# Patient Record
Sex: Female | Born: 1935 | Race: White | Hispanic: No | Smoking: Former smoker
Health system: Southern US, Community
[De-identification: ages and names within clinical notes are randomized; demographics above are authoritative.]

## PROBLEM LIST (undated history)

## (undated) DIAGNOSIS — G3183 Dementia with Lewy bodies: Principal | ICD-10-CM

## (undated) DIAGNOSIS — F0280 Dementia in other diseases classified elsewhere without behavioral disturbance: Secondary | ICD-10-CM

## (undated) DIAGNOSIS — C801 Malignant (primary) neoplasm, unspecified: Secondary | ICD-10-CM

## (undated) DIAGNOSIS — R413 Other amnesia: Secondary | ICD-10-CM

## (undated) HISTORY — PX: BREAST LUMPECTOMY: SHX2

## (undated) HISTORY — DX: Malignant (primary) neoplasm, unspecified: C80.1

## (undated) HISTORY — DX: Dementia in other diseases classified elsewhere without behavioral disturbance: F02.80

## (undated) HISTORY — DX: Dementia with Lewy bodies: G31.83

## (undated) HISTORY — DX: Other amnesia: R41.3

## (undated) HISTORY — PX: VAGINAL HYSTERECTOMY: SHX2639

---

## 2000-07-07 ENCOUNTER — Other Ambulatory Visit: Admission: RE | Admit: 2000-07-07 | Discharge: 2000-07-07 | Payer: Self-pay | Admitting: Internal Medicine

## 2002-11-27 ENCOUNTER — Encounter: Admission: RE | Admit: 2002-11-27 | Discharge: 2002-11-27 | Payer: Self-pay | Admitting: Orthopedic Surgery

## 2002-12-03 ENCOUNTER — Encounter: Payer: Self-pay | Admitting: Orthopedic Surgery

## 2002-12-03 ENCOUNTER — Encounter: Admission: RE | Admit: 2002-12-03 | Discharge: 2002-12-03 | Payer: Self-pay | Admitting: Orthopedic Surgery

## 2002-12-23 ENCOUNTER — Emergency Department (HOSPITAL_COMMUNITY): Admission: EM | Admit: 2002-12-23 | Discharge: 2002-12-23 | Payer: Self-pay | Admitting: Emergency Medicine

## 2002-12-23 ENCOUNTER — Encounter: Payer: Self-pay | Admitting: Emergency Medicine

## 2006-12-17 ENCOUNTER — Ambulatory Visit: Payer: Self-pay | Admitting: Internal Medicine

## 2006-12-24 ENCOUNTER — Ambulatory Visit: Payer: Self-pay | Admitting: Gastroenterology

## 2006-12-31 ENCOUNTER — Encounter (INDEPENDENT_AMBULATORY_CARE_PROVIDER_SITE_OTHER): Payer: Self-pay | Admitting: *Deleted

## 2006-12-31 ENCOUNTER — Ambulatory Visit (HOSPITAL_COMMUNITY): Admission: RE | Admit: 2006-12-31 | Discharge: 2006-12-31 | Payer: Self-pay | Admitting: Internal Medicine

## 2007-01-31 ENCOUNTER — Ambulatory Visit: Payer: Self-pay | Admitting: Internal Medicine

## 2009-04-19 ENCOUNTER — Ambulatory Visit: Payer: Self-pay | Admitting: Psychology

## 2009-06-28 ENCOUNTER — Observation Stay (HOSPITAL_COMMUNITY): Admission: AD | Admit: 2009-06-28 | Discharge: 2009-07-02 | Payer: Self-pay | Admitting: Neurology

## 2011-01-29 IMAGING — CT CT HEAD W/O CM
1 of 2 series · 16 of 30 positions shown, 20 images · non-contrast
Comparison: None.

CLINICAL DATA: Parkinson's disease.  Fell at home.  History of
breast malignancy.  Dementia.

CT HEAD WITHOUT CONTRAST
TECHNIQUE: Contiguous axial images were obtained from the base of
the skull through the vertex without contrast.

[Series 3: recon 2: brain · axial · 0.47mm/px · z∈[+138,+272]mm · 16 of 56 slices shown, 20 images]
[im 3/56  brain]
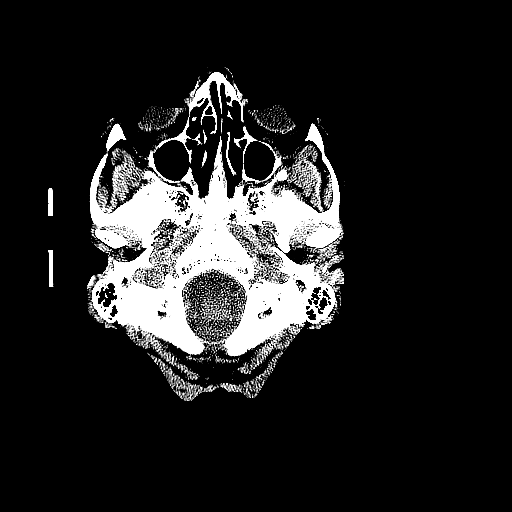
[im 3/56  bone]
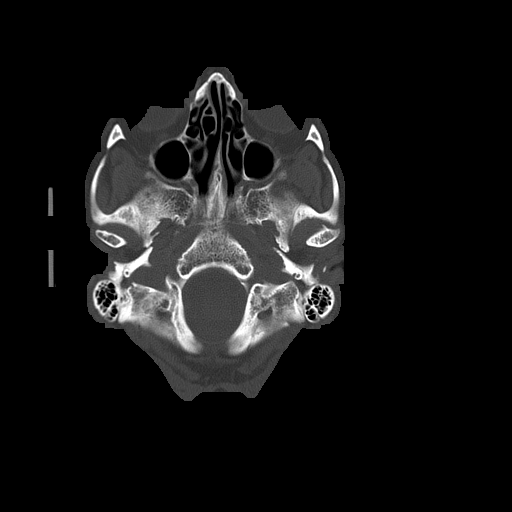
[im 6/56  brain]
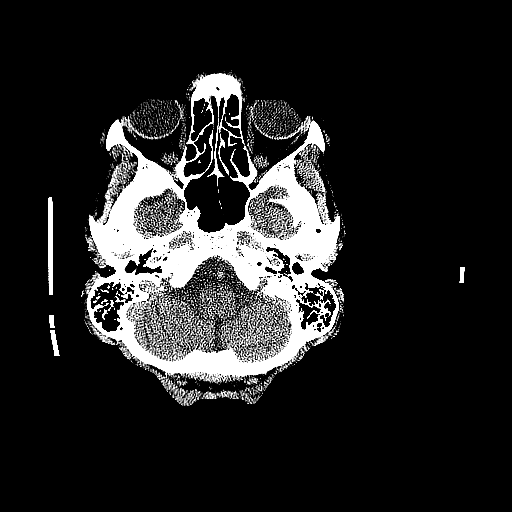
[im 9/56  brain]
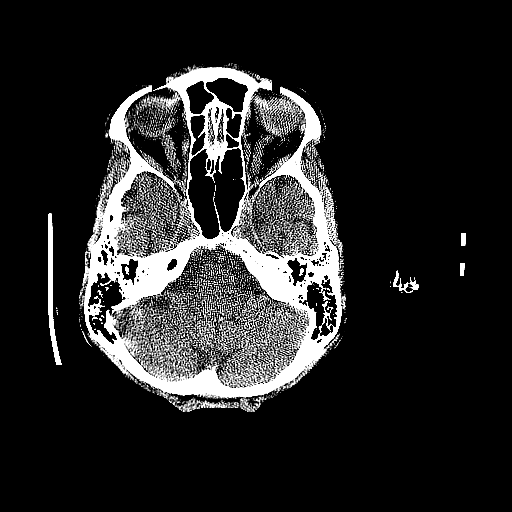
[im 12/56  brain]
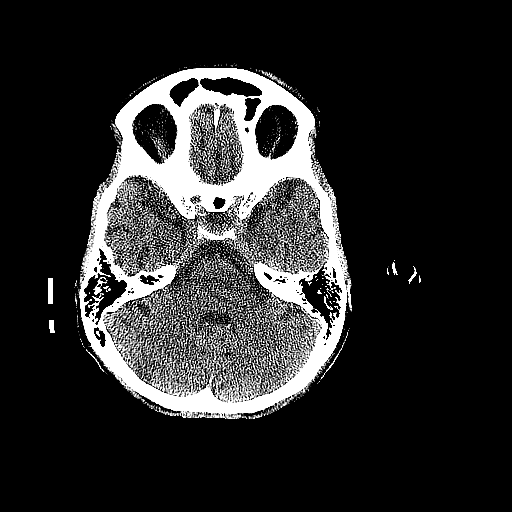
[im 18/56  brain]
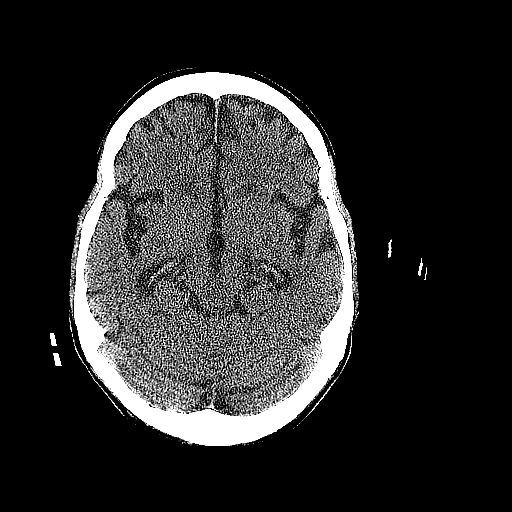
[im 18/56  bone]
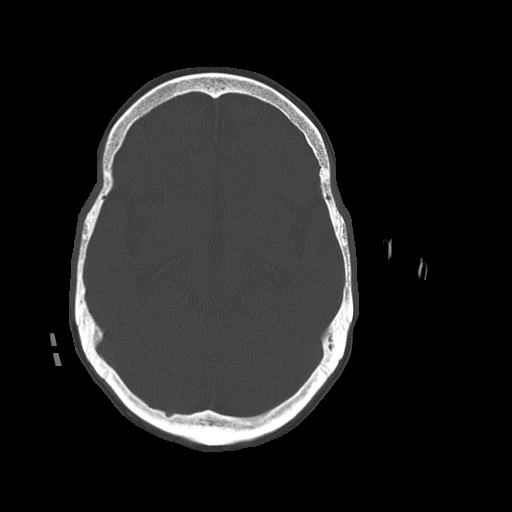
[im 21/56  brain]
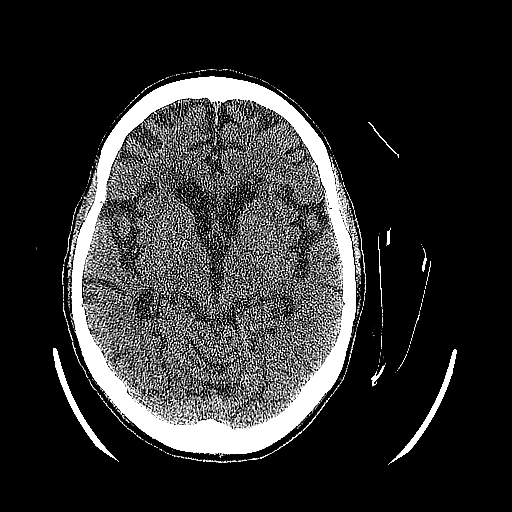
[im 24/56  brain]
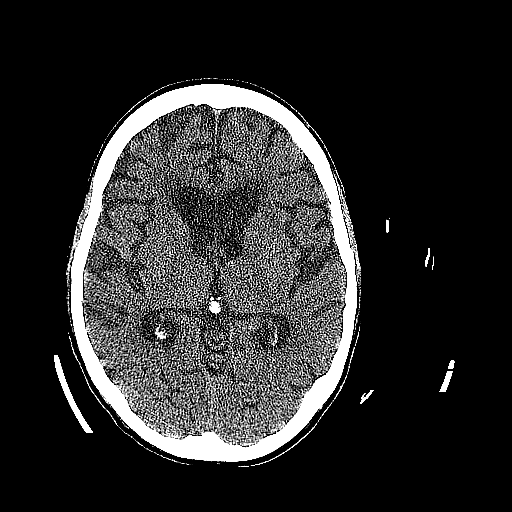
[im 27/56  brain]
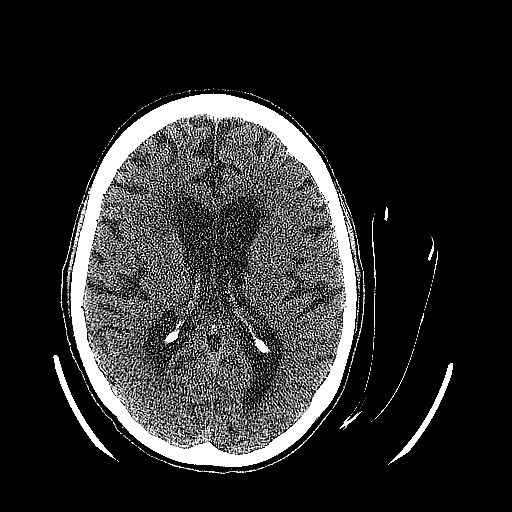
[im 29/56  brain]
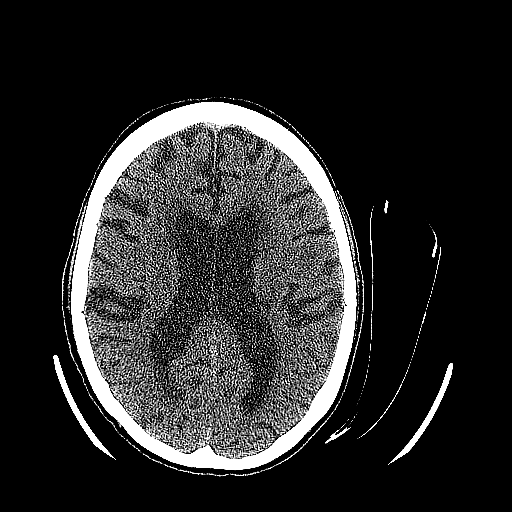
[im 29/56  bone]
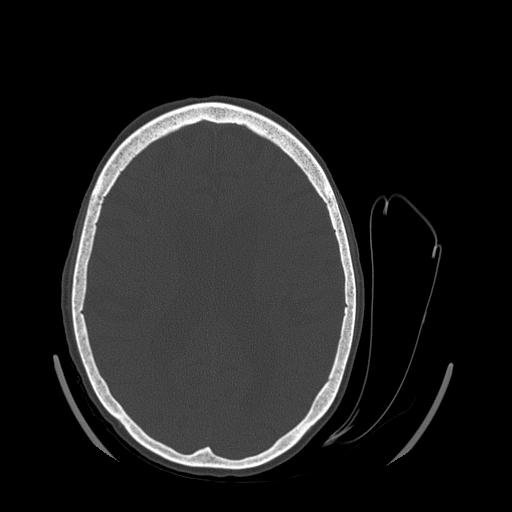
[im 32/56  brain]
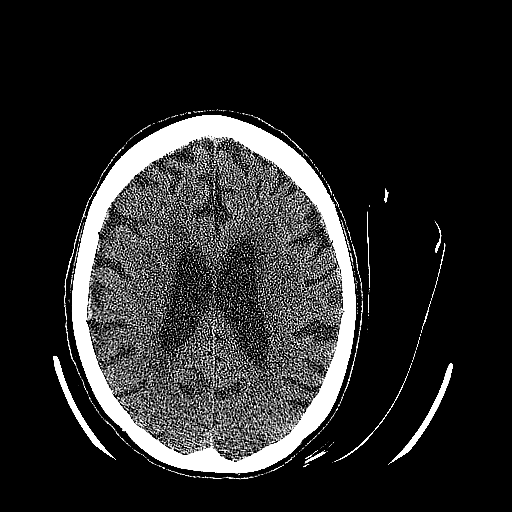
[im 35/56  brain]
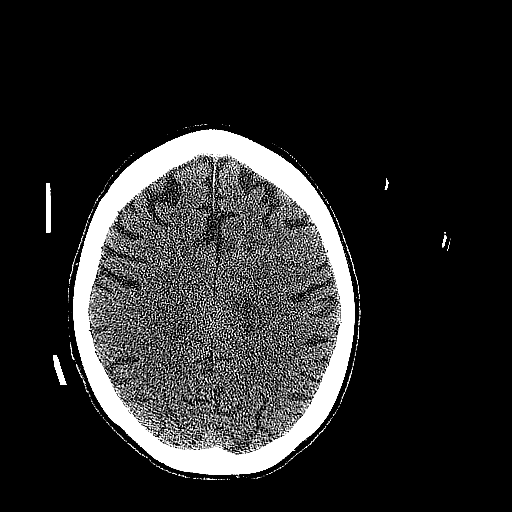
[im 38/56  brain]
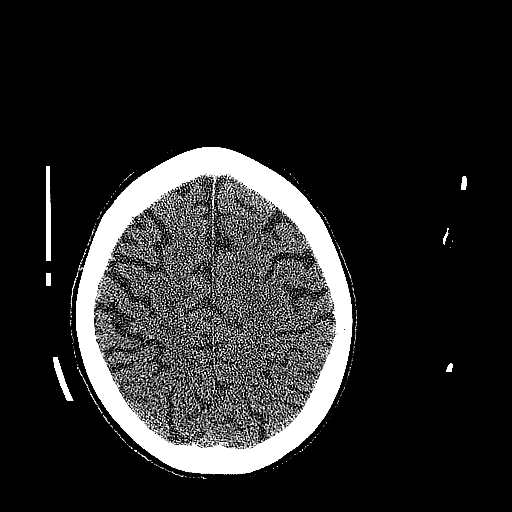
[im 44/56  brain]
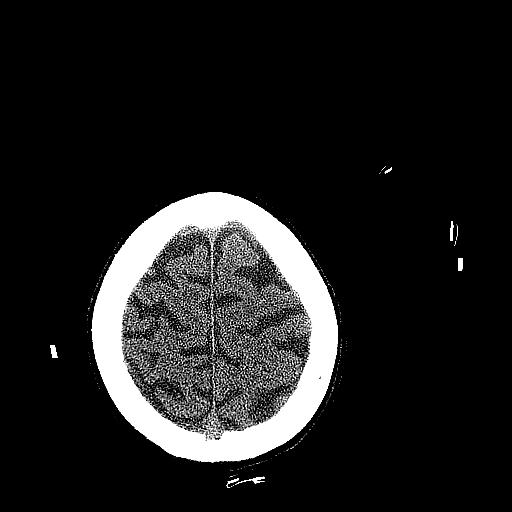
[im 44/56  bone]
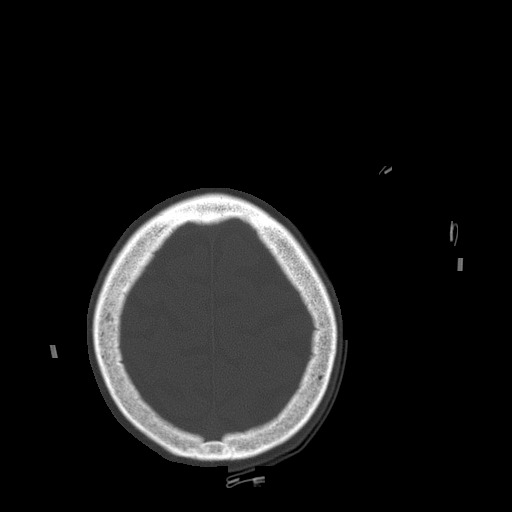
[im 47/56  brain]
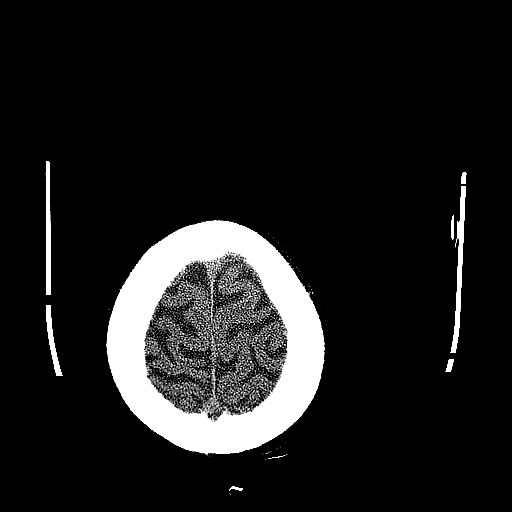
[im 50/56  brain]
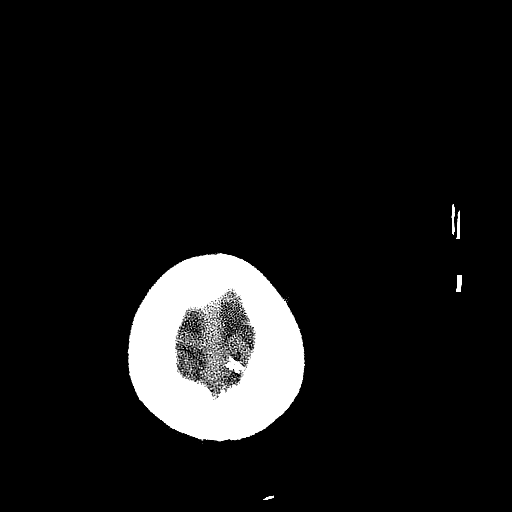
[im 53/56  brain]
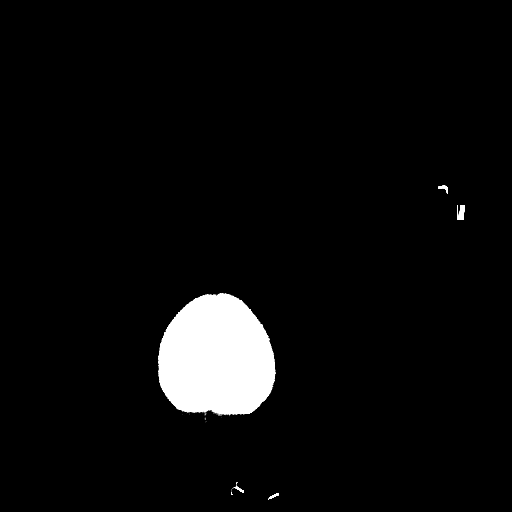

[16 of 30 positions shown; findings below may reference images not displayed]

FINDINGS: Moderate to severe atrophy is present.  Advanced chronic
microvascular ischemic change is present in the periventricular and
subcortical white matter. The calvarium is intact.  There is no
significant sinus or mastoid disease.  Mild vascular calcification
is present.
IMPRESSION: Atrophy and small vessel disease.  No acute intracranial findings.

## 2011-02-21 LAB — CBC
HCT: 38.2 % (ref 36.0–46.0)
Hemoglobin: 13.3 g/dL (ref 12.0–15.0)
MCHC: 34.8 g/dL (ref 30.0–36.0)
MCV: 94.7 fL (ref 78.0–100.0)
Platelets: 160 10*3/uL (ref 150–400)
RBC: 4.03 MIL/uL (ref 3.87–5.11)
RDW: 13.9 % (ref 11.5–15.5)
WBC: 6.4 10*3/uL (ref 4.0–10.5)

## 2011-02-21 LAB — BASIC METABOLIC PANEL
BUN: 14 mg/dL (ref 6–23)
CO2: 26 mEq/L (ref 19–32)
Calcium: 8.7 mg/dL (ref 8.4–10.5)
Chloride: 106 mEq/L (ref 96–112)
Creatinine, Ser: 0.85 mg/dL (ref 0.4–1.2)
GFR calc Af Amer: >60 mL/min (ref >60)
GFR calc non Af Amer: >60 mL/min (ref >60)
Glucose, Bld: 87 mg/dL (ref 70–99)
Potassium: 4.6 mEq/L (ref 3.5–5.1)
Sodium: 137 mEq/L (ref 135–145)

## 2011-02-21 LAB — DIFFERENTIAL
Basophils Absolute: 0 10*3/uL (ref 0.0–0.1)
Basophils Relative: 0 % (ref 0–1)
Eosinophils Absolute: 0.2 10*3/uL (ref 0.0–0.7)
Eosinophils Relative: 3 % (ref 0–5)
Lymphocytes Relative: 31 % (ref 12–46)
Lymphs Abs: 2 10*3/uL (ref 0.7–4.0)
Monocytes Absolute: 0.9 10*3/uL (ref 0.1–1.0)
Monocytes Relative: 14 % — ABNORMAL HIGH (ref 3–12)
Neutro Abs: 3.3 10*3/uL (ref 1.7–7.7)
Neutrophils Relative %: 51 % (ref 43–77)

## 2011-02-22 LAB — URINALYSIS, ROUTINE W REFLEX MICROSCOPIC
Glucose, UA: NEGATIVE mg/dL
Hgb urine dipstick: NEGATIVE
Ketones, ur: NEGATIVE mg/dL
pH: 6 (ref 5.0–8.0)

## 2011-02-22 LAB — COMPREHENSIVE METABOLIC PANEL
ALT: 25 U/L (ref 0–35)
Albumin: 3.2 g/dL — ABNORMAL LOW (ref 3.5–5.2)
Calcium: 8.8 mg/dL (ref 8.4–10.5)
GFR calc Af Amer: >60 mL/min (ref >60)
Glucose, Bld: 95 mg/dL (ref 70–99)
Sodium: 138 mEq/L (ref 135–145)
Total Protein: 6 g/dL (ref 6.0–8.3)

## 2011-02-22 LAB — CBC
Hemoglobin: 12.9 g/dL (ref 12.0–15.0)
MCHC: 35.2 g/dL (ref 30.0–36.0)
Platelets: 158 10*3/uL (ref 150–400)
RDW: 13.8 % (ref 11.5–15.5)

## 2011-03-31 NOTE — Discharge Summary (Signed)
[name], REXRODE NO.:  [id number]   MEDICAL RECORD NO.:  [id number]          PATIENT TYPE:  OBV   LOCATION:  3032                         FACILITY:  [facility]   PHYSICIAN:  Norma Boyd, M.D.  DATE OF BIRTH:  01-Jun-1930   DATE OF ADMISSION:  06/28/2009  DATE OF DISCHARGE:                               DISCHARGE SUMMARY   Norma Boyd was admitted on the Fridays June 28, 2009, to the [facility] service after showing progressive decline in her cognitive  and judgment ability.  The patient is scheduled to be discharged today  on July 01, 2009, to a nursing home facility that her daughters have  selected.   CURRENT MEDICATIONS:  1. Clonazepam 0.5 mg p.o. daily.  2. Clonazepam 2 mg at 8:00 p.m.  3. Cyclobenzaprine 10 mg at 8:00 p.m.  4. Valproic acid 500 mg in a.m. daily.  5. Magnesium hydroxide milk with a laxative 200 mg b.i.d.  6. Mobic 15 mg p.o. daily.  7. She is also on temazepam 30 mg p.r.n. for sleep at 8:00 p.m., if      needed.   The patient has no known medication allergies with a creatinine level of  0.85 and her height is 62 inches.  She is a 76 year old Caucasian right-  handed female, divorced, but resided with her former spouse at the time  of the admission.  She had previously spent some time in her daughter's  house, but also there, her confusions and hallucinations made living  with her and guiding her in activities of daily living very difficult.  She had multiple falls.  Her diagnosis is that of a Lewy body dementia.  A CT after a fall and she has a history of breast malignancy.  This was  ordered without contrast.  She had atrophy and small-vessel disease.  There were no acute intracranial findings as of June 30, 2009.  In  February of 2008, she had an abdominal ultrasound and she had an ankle x-  ray in February 2004.  She had no other brain images on the [facility]  records.   LABORATORY TESTS:  Normal Chem-7 with a sodium of  137, potassium of 4.6,  chloride 106, CO2 of 26, glucose 87, BUN 14, creatinine of 0.85, and  calcium 8.7.   Her vitals and today as of July 01, 2009, showed a systolic blood  pressure of 132/80, respirations of 18, pulse rate 74, temperature 97,  and 96% room air saturation.   Intake and output were not sufficiently measured.  There were very  various spotty day gathered by her nurse tech that cannot reflect the  normal intake and output for this patient who is mobile.   Plan for this patient is to be discharged today to a nursing home  facility.  Her outpatient physician or me has a Neurologist Dr. Porfirio Mylar  Dohmeier.  She has a psychiatrist, Dr. Donell Beers and she has a primary  care physician, Dr. Nickola Major, which all should be receiving a carbon copy  of today's discharge note.   DIAGNOSIS:  Lewy body  dementia with behavior problems, 290.3.      Norma Boyd, M.D.  Electronically Signed     Norma Boyd, M.D.  Electronically Signed    CD/MEDQ  D:  07/01/2009  T:  07/01/2009  Job:  [id number]   cc:   Archer Asa, M.D.  Zenovia Jordan, MD

## 2011-03-31 NOTE — H&P (Signed)
NAMENDEYE, TENORIO NO.:  [id number]   MEDICAL RECORD NO.:  [id number]          PATIENT TYPE:  INP   LOCATION:  3032                         FACILITY:  [facility]   PHYSICIAN:  Melvyn Novas, M.D.  DATE OF BIRTH:  1930-10-24   DATE OF ADMISSION:  06/28/2009  DATE OF DISCHARGE:  07/01/2009                              HISTORY & PHYSICAL   Norma Boyd is a 77 year old Caucasian right-handed female known to our  outpatient practice for progressive dementia with severe behavior  problems, especially hallucinations since 2008.  She has significantly  declined within the last 6 months.  She could no longer live alone and  her former spouse moved in with her to take care of her.  However in the  last 6-8 weeks, her daughters have noticed that she forgets meals that  she becomes very agitated and at night has tried to leave her house or  apartment.  She has been medicated and this seems to stabilize her day  and nighttime rhythm.  However, she is unable to be compliant with  medication and Norma Boyd himself is physically frail and no longer able  to assist her.  This admission should help to improve the medical  regimen for this patient with presumed Lewy body dementia.   HISTORY OF PRESENT ILLNESS:  The patient has fallen at home twice and  has declined food offered to her.  She seems to have some persecutory  paranoid ideas about things in her house being changed or taken away  from her.  She went with her daughters for weekend to the beach and  vividly had conversation with people that have been deceased.  She also  had trouble now recognizing some of her family members.  On June 03, 2009, she had fallen backwards and fell down some steps.  She seemed to  have had a thoracic bruising and some ribs were possibly cracked.  She  is, however, not willing to go to the doctor.  She is trembling.  She  has some arthritis already, motor deficits in form of range of motion  limitations.  She has lost further weight.  She appears agitated.   Out of a complete 12-system review, the patient had weight loss,  fatigue, difficulties with swallowing, rigidity tremors, bruising,  frequent urination, muscle pain, lower back pain, difficulties with  concentrating, confusion also affects her verbal output.  She had visual  change in the form of visual hallucinations.  She has Parkinsonian  features to her dementia.   SOCIAL HISTORY:  She is divorced, has 2 adult daughters, one works for  the [employer].  She is drinking caffeine.  She quit  tobacco in the 1970s.  She rarely drunk alcohol only socially.  She has  a high school degree and worked for [employer].   PAST MEDICAL HISTORY:  1. Arthritis.  2. Depression.  3. Anxiety.  4. High cholesterol.  5. She is a survival of cancer.   FAMILY HISTORY:  Father deceased of lung cancer with brain metastasis at  age 92.  Mother  deceased of a gangrene at age 69.  Her father had heart  disease but not coronary artery disease.  She insisted in one of her  first visit, I do not have any further information.  Diabetes affected  her father, her mother, and one of her daughters.   SURGICAL HISTORY:  None.   PHYSICAL EXAMINATION:  VITAL SIGNS:  Blood pressure is 118/78.  GENERAL:  The patient appears in no acute distress, is actually well  groomed, but is not alert or oriented.  She has symmetrical facial  features.  She has slight rigor of all joints but not of the neck.  She  herself has not reported any difficulty with taste, smell, or  swallowing.  SKIN:  No rash, no bruising.  ABDOMEN:  Soft and nontender.  NEUROLOGIC:  Awake, alert, cooperative, pleasant, but not able to follow  any commands.  Behavior is inappropriate.  The patient is looking for a  dog that has not longer been living with her and is actually at her  daughter's house as I understand.  Attention span and concentration  severely  impaired.  Full visual fields but saccadic eye movements.  Normal fundi.  No papilledema.  Abnormal tone.  Decreased grip strength  bilaterally.  Visible tremor in both hands.  Deep tendon reflexes are on  the brisk side.  Cerebella, the patient could perform a finger-nose-  finger test very slow and after multiple attempts to coach her.  Gait  and station, the patient walks fairly well with a wider-based balance  gait.  She seems to a tendency to drift to the left and fall.   A CT scan and imaging studies of the brain have not revealed any old  major strokes.  Our feeling is that the patient has Lewy body dementia  with progressive memory loss with some parkinsonian features,  hallucinations.  Daughter has used Flexeril at home to help the patient  fall asleep.  Temazepam had been stopped and changed to Klonopin.  Now,  temazepam was re-introduced by Dr. Donell Beers and Dr. Nickola Major, her  psychiatrist and primary care physician.  She is also on Depakote for  mood stabilization but seems to have had minimal response to it so far.   OTHER MEDICATIONS:  1. Mobic for joint pain.  2. She is taking Klonopin 0.5 mg b.i.d. for anxiety.  She can take up      to 2 mg at night.  3. Flexeril up to 10 mg at night p.r.n.  4. She is on Aricept 10 mg.   Goal is to discharge the patient from here to a nursing facility,  preferably one with a Memory Unit.      Melvyn Novas, M.D.  Electronically Signed     Melvyn Novas, M.D.  Electronically Signed    CD/MEDQ  D:  06/30/2009  T:  06/30/2009  Job:  [id number]   cc:   Archer Asa, M.D.  Zenovia Jordan, MD

## 2011-04-03 NOTE — Assessment & Plan Note (Signed)
[facility]                         GASTROENTEROLOGY OFFICE NOTE   NAME:Boyd, Norma NUTTLE                       MRN:          [id number]  DATE:12/17/2006                            DOB:          03/24/1930    The patient is self-referred.   REASON FOR CONSULTATION:  Abdominal complaints.   HISTORY:  This is a 77 year old white female who was seen in January of  2003 as a direct referral for screening colonoscopy.  She was found to  have internal hemorrhoids and left-sided diverticulosis with marked  changes and stenosis.  She has not been seen since.  She is accompanied  today by her daughter, who made this patient's appointment.  They report  a 6-week history of problems with morning nausea, exacerbated by meals.  Occasional vomiting approximately once a week.  As well, some epigastric  soreness or tenderness.  She describes indigestion.  Apparently has a  history of reflux disease complicated by peptic stricture, for which she  has undergone prior esophageal dilation.  Other complaints include gas,  bloating, and constipation.  She has had decreased appetite and 10 to 15  pound weight loss.  As part of her workup, she underwent laboratory  testing.  CBC, comprehensive metabolic panel, amylase were all normal.  Urinalysis was negative.  Stool testing for Hemoccult was negative x3.  She was, however, noted to have a positive Helicobacter pylori antibody.  For this I am told she was treated with a 10-day course of Flagyl and  tetracycline.  She was also supposed to take a b.i.d. proton pump  inhibitor, but failed to do so.  She took just 2 doses of Prevacid.  They deny melena or hematochezia.   PAST MEDICAL HISTORY:  1. Hyperlipidemia.  2. Arthritis.  3. Anxiety and depression.  4. Remote history of breast cancer.  5. Early Alzheimer's dementia.   PAST SURGICAL HISTORY:  1. Hysterectomy (ovaries intact).  2. Breast surgery.  3. Appendectomy as  a child.  4. Rotator cuff surgery.  5. Esophageal dilation 15 years ago.   ALLERGIES:  No known drug allergies.   CURRENT MEDICATIONS:  Aricept unspecified dosage.  Fish oil.  Effexor  unspecified dosage.  Namenda unspecified dosage.  Phenergan p.r.n.  Vitamin B.  Fish oil.  Multivitamin.   FAMILY HISTORY:  Negative for gastrointestinal malignancy.   SOCIAL HISTORY:  The patient is divorced with 2 children.  She is  accompanied by 1 of her daughters.  She lives alone.  She is retired.  She does not smoke.  Occasionally uses alcohol.   REVIEW OF SYSTEMS:  Per diagnostic evaluation form.   PHYSICAL EXAM:  A well-appearing female in no acute distress.  Blood pressure 120/76, heart rate 76, weight 107.4 pounds.  She is 5  feet 1 inch in height.  HEENT:  Sclerae anicteric.  Conjunctivae pink.  Oral mucosa intact.  There is no adenopathy.  LUNGS:  Clear.  HEART:  Regular.  ABDOMEN:  Soft without tenderness, mass, or hernia.  No succussion  splash.  Good bowel sounds heard.  EXTREMITIES:  Without edema.  IMPRESSION:  1. This is a 77 year old female with a 6-week history of dyspepsia,      nausea, decreased appetite and weight loss.  Also, positive      Helicobacter pylori antibody, which was treated with antibiotics      only.  Her problems are most likely acid-peptic in nature, either      reflux disease or peptic ulcer disease.  Gallbladder disease also      possible.  2. History of diverticulosis on screening colonoscopy January 2003.  3. Multiple general medical problems as discussed above.   RECOMMENDATIONS:  1. Initiate Prevacid 30 mg p.o. b.i.d.  Multiple samples have been      provided.  Thereafter, she can obtain Prilosec OTC 20 mg daily.  2. Schedule upper endoscopy and abdominal ultrasound to evaluate      symptoms.  The nature of the procedure as well as risks, benefits,      and alternatives have been reviewed.  She understood and agreed to      proceed.  3.  Continue Phenergan as needed.  Additional prescription provided      upon their request.  4. Ongoing general medical care with Dr. Lendon Colonel.     Wilhemina Bonito. Marina Goodell, MD  Electronically Signed    JNP/MedQ  DD: 12/19/2006  DT: 12/19/2006  Job #: [id number]   cc:   Dr. Fredia Beets

## 2013-05-24 ENCOUNTER — Encounter: Payer: Self-pay | Admitting: Neurology

## 2013-05-24 ENCOUNTER — Telehealth: Payer: Self-pay | Admitting: Neurology

## 2013-05-24 ENCOUNTER — Ambulatory Visit (INDEPENDENT_AMBULATORY_CARE_PROVIDER_SITE_OTHER): Payer: Medicare Other | Admitting: Neurology

## 2013-05-24 VITALS — BP 154/78 | HR 91 | Resp 16 | Ht 60.0 in | Wt 123.0 lb

## 2013-05-24 DIAGNOSIS — F0280 Dementia in other diseases classified elsewhere without behavioral disturbance: Secondary | ICD-10-CM

## 2013-05-24 DIAGNOSIS — G3183 Dementia with Lewy bodies: Secondary | ICD-10-CM

## 2013-05-24 MED ORDER — TRAZODONE HCL 50 MG PO TABS: 50.0000 mg | ORAL_TABLET | Freq: Every day | ORAL | Status: DC

## 2013-05-24 MED ORDER — CARBIDOPA-LEVODOPA 10-100 MG PO TABS: 1.0000 | ORAL_TABLET | Freq: Three times a day (TID) | ORAL | Status: DC

## 2013-05-24 MED ORDER — QUETIAPINE FUMARATE 25 MG PO TABS: 25.0000 mg | ORAL_TABLET | Freq: Two times a day (BID) | ORAL | Status: DC

## 2013-05-24 NOTE — Addendum Note (Signed)
Addended by: Melvyn Novas on: 05/24/2013 03:42 PM   Modules accepted: Orders

## 2013-05-24 NOTE — Progress Notes (Signed)
[facility]  Provider:  Dr Vickey Huger Referring Provider: No ref. provider found Primary Care Physician:  Celine Mans, MD  Chief Complaint  Patient presents with  . Follow-up    dementia, rm 10    HPI:  Norma Boyd is a 79 y.o. female here as a referral from Dr.Terry Anold .  She resides at [facility] within  a Memory Unit.    Mrs. Norma Boyd, and he might 79 year old right-handed Caucasian female was in  2010  hospitalized after an acute delirious decompensation and behavior changes. She was admitted to behavior health for 12 days, and  afterwards in  [facility].  The patient's underlying memory loss has progressed : she also lost a lot of weight over the first 3 years of her illness, unable to remember meals, she eats very slowly.   followup she is now stable and has not changed her weight since July of last year it began when she breathed 110 pounds.  Meanwhile her appetite seems to have improved and the patient is been very active as an open facility goes on field trips and walks the halls  incessantly.  The patient believes  She is  employed there and tries to help and assist.  She has  a very shuffling gait , which begun placing her at a higher risk of falls. She has no dominant tremor.  At the senior center she is currently treated with Seroquel 25 mg 3 times a day which is less likely to cause parkinsonism  (she also no longer  receives Valium nor Haldol, due to sec, parkinonism,   xanax which is shorter acting and seems to have helped her agitation better.  Social history :  the patient has 2 adult daughters ,she was long-time divorced when her former  husband moved back into her house to assist her . She  has one sister with schizophrenia / possible bipolar disorder.   The patient used to enjoy caffeine she is still drinking coffee. She quit smoking in the 1970s she denies any drug use she has never been a heavy drinker.  Family  history : her father died at age 58 after a lung cancer metastasized to the brain her mother died at age 60 after that a. gangrene with sepsis developed. The patient herself  has a cancer of the breast history,  her father had heart disease and diabetes,  her mother had diabetes as well.  The last 2 visits we have been unable to obtain an am and has equal Mini-Mental test done in 2012 score was 10 points- the test however is not important at this degree of stage of her disease.   In July 2013 the patient had a fall and and was evaluated at [facility] emergency room. She had reported fall since. The patient is conversant, but not necessarily cooperative. The patient's daughter reports that her mother has started to chew tablets and capsules instead  of swallowing them all. The patient also had the sudden 12 reported that she couldn't smell as well anymore I think that she snored having impaired taste sensation as well.         Review of Systems: Out of a complete 14 system review, the patient complains of only the following symptoms, and all other reviewed systems are negative. Dementia   History   Social History  . Marital Status: Divorced    Spouse Name: N/A    Number of Children: 2  . Years of  Education: 12   Occupational History  .      former Education officer, environmental   Social History Main Topics  . Smoking status: Former Games developer  . Smokeless tobacco: Not on file     Comment: quit in 70's  . Alcohol Use: Yes     Comment: socially, but for many months  . Drug Use: No  . Sexually Active: Not on file   Other Topics Concern  . Not on file   Social History Narrative  . No narrative on file    No family history on file.  Past Medical History  Diagnosis Date  . Memory loss   . LBD (Lewy body dementia)     No past surgical history on file.  Current Outpatient Prescriptions  Medication Sig Dispense Refill  . acetaminophen (TYLENOL) 500 MG tablet Take 500 mg by mouth every 6  (six) hours as needed for pain. Take 500mg   Every six hours as needed for pain      . ALPRAZolam (XANAX) 0.25 MG tablet Take 0.25 mg by mouth. At bedtime,as needed      . carbidopa-levodopa (SINEMET) 10-100 MG per tablet Take 1 tablet by mouth 3 (three) times daily. 9am,1pm, 4pm      . DIVALPROEX SODIUM PO Take 125 mg by mouth 2 (two) times daily. 4 capsules  At bedtime, do not crush      . loperamide (ANTI-DIARRHEAL) 2 MG tablet Take 2 mg by mouth. 1 tablet after  Each loose stools up to 6 tablets in 24 hours      . QUEtiapine (SEROQUEL) 25 MG tablet Take 1 tablet (25 mg total) by mouth 2 (two) times daily. One dose at lunchtime, and one at bedtime  60 tablet  11  . Sennosides-Docusate Sodium (SENNA PLUS PO) Take by mouth daily. 2 tablets at bedtime, do not crush      . traZODone (DESYREL) 50 MG tablet Take 1 tablet (50 mg total) by mouth at bedtime. 1/2 to 1 daily  30 tablet  11   No current facility-administered medications for this visit.    Allergies as of 05/24/2013 - Review Complete 05/24/2013  Allergen Reaction Noted  . Haldol (haloperidol lactate)  05/24/2013  . Namenda (memantine hcl)  05/24/2013    Vitals: BP 154/78  Pulse 91  Ht 5' (1.524 m)  Wt 123 lb (55.792 kg)  BMI 24.02 kg/m2 Last Weight:  Wt Readings from Last 1 Encounters:  05/24/13 123 lb (55.792 kg)   Last Height:   Ht Readings from Last 1 Encounters:  05/24/13 5' (1.524 m)     Physical exam:  General: The patient is awake, appears not in acute distress. The patient is well groomed. Head: Normocephalic, atraumatic. Neck is supple.  neck circumference: 13 inches ,  Cardiovascular:  Regular rate and rhythm, without  murmurs or carotid bruit, and without distended neck veins. Respiratory: Lungs are clear to auscultation. Skin:  Without evidence of edema, or rash Trunk: BMI is normal posture.  Neurologic exam : The patient is awake and alert, oriented to place and time.  Memory , attention span &  concentration ability are severely limited. Speech is fluent , repetitive . Mood and affect are agitated.   Cranial nerves: Pupils are equal and briskly reactive to light.  Extraocular movements  in vertical and horizontal planes  Are saccadic ,coarse  and with nystagmus. Visual fields by finger perimetry are intact. Hearing to finger rub intact.  Facial sensation intact to fine touch.  Facial motor strength is symmetric and tongue and uvula move midline.  Motor exam:   Coarsely elevated  tone and normal muscle bulk , symmetric normal strength in all extremities.  Sensory:  Fine touch, pinprick and vibration were tested in all extremities patient was not able to appreciate these.  Mild  tremor.  Gait and station: Patient walks without  assistive device.  Deep tendon reflexes: in the  upper and lower extremities were not tested, the patient did not give permission.  D/c excelon ,   continue  Seroquel.    Assessement :  Lewy body dementia , 4 th year of her illness.

## 2013-05-24 NOTE — Patient Instructions (Signed)
Dementia With Lewy Bodies  °Dementia with Lewy bodies is a common type of dementia that gets progressively worse with time. Typical characteristics include progressive worsening of mental function combined with 3 other features: seeing things that are not there (hallucinations), significant fluctuations in alertness and attention, and changes in movement similar to Parkinson's disease (rigid muscles, slowed movement, and tremors). Dementia with Lewy bodies has many similar symptoms that Parkinson's disease and Alzheimer's disease has.  °CAUSES  °The symptoms of dementia with Lewy bodies are caused by the buildup of Lewy bodies, which are proteins that build up in brain cells and affect mental function and movement. No one knows exactly what causes the buildup of Lewy bodies, but it may be related to Alzheimer's dementia or Parkinson's disease.  °SYMPTOMS  °Memory problems.  °Confusion.  °Reduced attention span.  °Hallucinations.  °False ideas about another person or situation (delusions).  °Trouble moving (rigid muscles, slowed movement, and tremors).  °Shuffling movements (gait).  °Sleep problems (acting out dreams).  °Fluctuating attention (periods of drowsiness or lethargy, long periods of time spent staring into space, or disorganized speech). °DIAGNOSIS  °There is no specific test to diagnose dementia with Lewy bodies, but your caregiver will evaluate you based on your symptoms and physical exam. Your evaluation may also include:  °Memory testing.  °Lab tests.  °Brain imaging (CT scan, MRI).  °Electroencephalogram (EEG).  °Lumbar puncture. °TREATMENT  °There is no cure for dementia with Lewy bodies. Medicines may be prescribed to help with mental function and movement.  °HOME CARE INSTRUCTIONS  °The care of individuals with dementia is varied and dependent upon the progression of the dementia. The following suggestions are intended for the person living with, or caring for, the person with dementia.  °Create a  safe environment.  °Remove the locks on bathroom doors to prevent the person from accidentally locking himself or herself in.  °Use childproof latches on kitchen cabinets and any place where cleaning supplies, chemicals, or alcohol are kept.  °Use childproof covers in unused electrical outlets.  °Install childproof devices to keep doors and windows secured.  °Remove stove knobs or install safety knobs and an automatic shut-off on the stove.  °Lower the temperature on water heaters.  °Label medicines and keep them locked up.  °Secure knives, lighters, matches, power tools, and guns, and keep these items out of reach.  °Keep the house free from clutter. Remove rugs or anything that might contribute to a fall.  °Remove objects that might break and hurt the person.  °Make sure lighting is good, both inside and outside.  °Install grab rails as needed.  °Use a monitoring device to alert you to falls or other needs for help.  °Reduce confusion.  °Keep familiar objects and people around.  °Use night lights or dim lights at night.  °Label items or areas.  °Use reminders, notes, or directions for daily activities or tasks.  °Keep a simple, consistent routine for waking, meals, bathing, dressing, and bedtime.  °Create a calm, quiet environment.  °Place large clocks and calendars prominently.  °Display emergency numbers and the home address near all telephones.  °Use cues to establish different times of the day. An example is to open curtains to let the natural light in during the day.  °Use effective communication.  °Choose simple words and short sentences.  °Use a gentle, calm tone of voice.  °Be careful not to interrupt.  °If the person is struggling to find a word or communicate a thought,   try to provide the word or thought.  °Ask one question at a time. Allow the person ample time to answer questions. Repeat the question again if the person does not respond.  °Reduce nighttime restlessness.  °Provide a comfortable bed.    °Have a consistent nighttime routine.  °Ensure a regular walking or physical activity schedule. Involve the person in daily activities as much as possible.  °Limit napping during the day.  °Limit caffeine.  °Attend social events that stimulate rather than overwhelm the senses.  °Encourage good nutrition and hydration.  °Reduce distractions during meal times and snacks.  °Avoid foods that are too hot or too cold.  °Monitor chewing and swallowing ability.  °Continue with routine vision, hearing, dental, and medical screenings.  °Only give over-the-counter or prescription medicines as directed by the caregiver.  °Monitor driving abilities. Do not allow the person to drive when safe driving is no longer possible.  °Register with an identification program which could provide location assistance in the event of a missing person situation. °SEEK MEDICAL CARE IF:  °New behavioral problems develop, such as moodiness or aggressiveness.  °Any new problem with brain function develop, such as balance, speech, or falling a lot.  °Problems with swallowing develop. °Small changes or worsening in any aspect of brain function can be a sign that the illness is getting worse. It can also be a sign of another medical illness such as infection. Seeing a caregiver right away is important.  °SEEK IMMEDIATE MEDICAL CARE IF:  °A fever develops.  °Confusion develops or worsens.  °New or worsened sleepiness develops or staying awake becomes difficult. °Document Released: 07/25/2002 Document Revised: 01/25/2012 Document Reviewed: 06/29/2011  °ExitCare® Patient Information ©2014 [employer]. ° °

## 2013-05-25 NOTE — Telephone Encounter (Signed)
Called and spoke Norma Boyd patient's daughter and she agree's with Dr.Dohmeier plan for her medication. Please tell Dr.Dohmeier I will be with her at her next visit.

## 2013-05-26 ENCOUNTER — Telehealth: Payer: Self-pay | Admitting: Neurology

## 2013-05-30 ENCOUNTER — Telehealth: Payer: Self-pay | Admitting: Neurology

## 2013-05-30 DIAGNOSIS — F0280 Dementia in other diseases classified elsewhere without behavioral disturbance: Secondary | ICD-10-CM

## 2013-05-30 MED ORDER — TRAZODONE HCL 50 MG PO TABS: 50.0000 mg | ORAL_TABLET | Freq: Every day | ORAL | Status: DC

## 2013-05-30 NOTE — Telephone Encounter (Signed)
I was finally able to reach Norma Boyd.  She said they cannot accept directions of 1/2-1 tablet for Trazodone.  The instructions must either read 1/2 daily or one full tab daily.  Since she is at a residence, they must have very specific directions.  Dr Vickey Huger, would you like to change the Rx to 1/2 daily or to 1 daily?  Please advise.  Thank you.

## 2013-05-30 NOTE — Telephone Encounter (Signed)
Ordered one tab nightly po.

## 2013-05-31 NOTE — Telephone Encounter (Signed)
Called the facility and gave Dr. Vickey Huger orders.

## 2013-11-24 ENCOUNTER — Ambulatory Visit (INDEPENDENT_AMBULATORY_CARE_PROVIDER_SITE_OTHER): Payer: Medicare Other | Admitting: Nurse Practitioner

## 2013-11-24 ENCOUNTER — Encounter: Payer: Self-pay | Admitting: Nurse Practitioner

## 2013-11-24 ENCOUNTER — Encounter (INDEPENDENT_AMBULATORY_CARE_PROVIDER_SITE_OTHER): Payer: Self-pay

## 2013-11-24 VITALS — BP 124/72 | HR 92 | Ht 58.5 in | Wt 124.0 lb

## 2013-11-24 DIAGNOSIS — G3183 Dementia with Lewy bodies: Principal | ICD-10-CM

## 2013-11-24 DIAGNOSIS — F0280 Dementia in other diseases classified elsewhere without behavioral disturbance: Secondary | ICD-10-CM

## 2013-11-24 NOTE — Patient Instructions (Signed)
Per nursing home sheet 

## 2013-11-24 NOTE — Progress Notes (Signed)
[facility]  PATIENT: Norma Boyd DOB: 03-Jun-1930   REASON FOR VISIT: follow up for dementia   HISTORY OF PRESENT ILLNESS:Norma Boyd, 80 year old returns for followup. She has a history of Lewy body dementia. She is unable to cooperate for Mini-Mental Status exam. She is currently in a nursing facility. She had recent hospitalization at [facility] for urinary tract infection. Weight is stable. Her Exelon was discontinued by Dr. Brett Boyd at last visit on 05/24/2013. She remains on Seroquel and Xanax for agitation. No recent falls. She is accompanied by her daughter today    HISTORY: 35 year old right-handed Caucasian female was in 2010 hospitalized after an acute delirious decompensation and behavior changes. She was admitted to behavior health for 12 days, and afterwards in [facility].  The patient's underlying memory loss has progressed : she also lost a lot of weight over the first 3 years of her illness, unable to remember meals, she eats very slowly.  followup she is now stable and has not changed her weight since July of last year it began when she breathed 110 pounds.  Meanwhile her appetite seems to have improved and the patient is been very active as an open facility goes on field trips and walks the halls incessantly.  The patient believes She is employed there and tries to help and assist.  She has a very shuffling gait , which begun placing her at a higher risk of falls. She has no dominant tremor.  At the senior center she is currently treated with Seroquel 25 mg 3 times a day which is less likely to cause parkinsonism (she also no longer receives Valium nor Haldol, due to sec, parkinonism,  xanax which is shorter acting and seems to have helped her agitation better.      REVIEW OF SYSTEMS: Full 14 system review of systems performed and notable only for those listed, all others are neg:  Constitutional: N/A  Cardiovascular: N/A    Ear/Nose/Throat: N/A  Skin: N/A  Eyes: N/A  Respiratory: N/A  Gastroitestinal: N/A  Hematology/Lymphatic: N/A  Endocrine: N/A Musculoskeletal:N/A  Allergy/Immunology: N/A  Neurological:memory Psychiatric: N/A   ALLERGIES: Allergies  Allergen Reactions  . Haldol [Haloperidol Lactate]   . Norma Boyd]     HOME MEDICATIONS: Outpatient Prescriptions Prior to Visit  Medication Sig Dispense Refill  . acetaminophen (TYLENOL) 500 MG tablet Take 500 mg by mouth every 6 (six) hours as needed for pain. Take 500mg   Every six hours as needed for pain      . ALPRAZolam (XANAX) 0.25 MG tablet Take 0.25 mg by mouth. At bedtime,as needed      . carbidopa-levodopa (SINEMET) 10-100 MG per tablet Take 1 tablet by mouth 3 (three) times daily. 9am,1pm, 4pm  90 tablet  11  . DIVALPROEX SODIUM PO Take 125 mg by mouth 2 (two) times daily. 4 capsules  At bedtime, do not crush      . loperamide (ANTI-DIARRHEAL) 2 MG tablet Take 2 mg by mouth. 1 tablet after  Each loose stools up to 6 tablets in 24 hours      . QUEtiapine (SEROQUEL) 25 MG tablet Take 1 tablet (25 mg total) by mouth 2 (two) times daily. One dose at lunchtime, and one at bedtime  60 tablet  11  . Sennosides-Docusate Sodium (SENNA PLUS PO) Take by mouth daily. 2 tablets at bedtime, do not crush      . traZODone (DESYREL) 50 MG tablet Take 1 tablet (50 mg total)  by mouth at bedtime. 1 daily  30 tablet  11   No facility-administered medications prior to visit.    PAST MEDICAL HISTORY: Past Medical History  Diagnosis Date  . Memory loss   . LBD (Lewy body dementia)   . Cancer     PAST SURGICAL HISTORY: Past Surgical History  Procedure Laterality Date  . Breast lumpectomy Left   . Vaginal hysterectomy      FAMILY HISTORY: History reviewed. No pertinent family history.  SOCIAL HISTORY: History   Social History  . Marital Status: Divorced    Spouse Name: N/A    Number of Children: 2  . Years of Education: 12    Occupational History  .      former Armed forces logistics/support/administrative officer   Social History Main Topics  . Smoking status: Former Research scientist (life sciences)  . Smokeless tobacco: Never Used     Comment: quit in 70's  . Alcohol Use: No  . Drug Use: No  . Sexual Activity: Not on file   Other Topics Concern  . Not on file   Social History Narrative   Patient is divorced and lives in a nursing home where her ex-husband is also there.   Patient has two children.   Patient is retired.   Patient has a GED.   Patient is right-handed.   Patient drinks two glasses of tea daily and maybe one cup of coffee.     PHYSICAL EXAM  Filed Vitals:   11/24/13 1441  BP: 124/72  Pulse: 92  Height: 4' 10.5" (1.486 m)  Weight: 124 lb (56.246 kg)   Body mass index is 25.47 kg/(m^2).  Generalized: Well developed, well groomed in no acute distress  Head: normocephalic and atraumatic,. Oropharynx benign  Neck: Supple, no carotid bruits  Cardiac: Regular rate rhythm, no murmur  Musculoskeletal: No deformity   Neurological examination   Mentation: Alert oriented to time, place, refused MMSE. Memory is severely limited Follows some  commands.  Cranial nerve II-XII: Pupils were equal round reactive to light extraocular movements were full, visual field were full on confrontational test. Facial sensation and strength were normal. hearing was intact to finger rubbing bilaterally. Uvula tongue midline. head turning and shoulder shrug were normal and symmetric.Tongue protrusion into cheek strength was normal. Motor: increased  tone, full strength in the BUE, BLE,  No focal weakness Sensory: not reliable Coordination: unable to perform Gait and Station: Rising up from seated position without assistance, normal stance,  moderate stride, good arm swing, smooth turning, no assistive device  DIAGNOSTIC DATA (LABS, IMAGING, TESTING) -None to review    ASSESSMENT AND PLAN  80 y.o. year old female  has a past medical history of Memory loss; LBD  (Lewy body dementia); and Cancer. here to followup. She is no longer on Exelon. She remains on Seroquel and Xanax for agitation. She resides in a nursing facility  Continue current meds. F/U in 6 to 8 months  Dennie Bible, [facility], [facility], APRN  [facility] [address] [place], [place] [address] [phone]

## 2014-04-20 ENCOUNTER — Telehealth: Payer: Self-pay | Admitting: Neurology

## 2014-04-20 NOTE — Telephone Encounter (Signed)
Lab not necessary, she is on extremely small dose.

## 2014-04-20 NOTE — Telephone Encounter (Signed)
Spoke with daughter(Sherry)and she said that the [facility] ([place], [place]), is wanting to know if patient should have blood drawn to check levels since patient is on Divalproex. If the labs need to be drawn, the order can be sent to Dr Roselie Awkward at [facility].

## 2014-04-20 NOTE — Telephone Encounter (Signed)
Spoke with daughter(Sherry) and shared Ms Martin's message, she verbalized understanding

## 2014-06-27 ENCOUNTER — Ambulatory Visit: Payer: Medicare Other | Admitting: Neurology

## 2014-07-03 ENCOUNTER — Ambulatory Visit (INDEPENDENT_AMBULATORY_CARE_PROVIDER_SITE_OTHER): Payer: Medicare Other | Admitting: Neurology

## 2014-07-03 ENCOUNTER — Encounter: Payer: Self-pay | Admitting: Neurology

## 2014-07-03 VITALS — BP 140/83 | HR 85 | Resp 17 | Wt 123.0 lb

## 2014-07-03 DIAGNOSIS — G3183 Dementia with Lewy bodies: Principal | ICD-10-CM

## 2014-07-03 DIAGNOSIS — F0280 Dementia in other diseases classified elsewhere without behavioral disturbance: Secondary | ICD-10-CM

## 2014-07-03 MED ORDER — TRAZODONE HCL 50 MG PO TABS
50.0000 mg | ORAL_TABLET | Freq: Every day | ORAL | Status: AC
Start: 2014-07-03 — End: ?

## 2014-07-03 MED ORDER — CARBIDOPA-LEVODOPA 10-100 MG PO TABS: 1.0000 | ORAL_TABLET | Freq: Three times a day (TID) | ORAL | Status: DC

## 2014-07-03 MED ORDER — QUETIAPINE FUMARATE 25 MG PO TABS: 25.0000 mg | ORAL_TABLET | Freq: Two times a day (BID) | ORAL | Status: DC

## 2014-07-03 NOTE — Patient Instructions (Signed)
Dementia With Lewy Bodies Dementia with Lewy bodies is a common type of dementia that gets progressively worse with time. Typical characteristics include progressive worsening of mental function combined with 3 other features: seeing things that are not there (hallucinations), significant fluctuations in alertness and attention, and changes in movement similar to Parkinson's disease (rigid muscles, slowed movement, and tremors). Dementia with Lewy bodies has many similar symptoms that Parkinson's disease and Alzheimer's disease has. CAUSES The symptoms of dementia with Lewy bodies are caused by the buildup of Lewy bodies, which are proteins that build up in brain cells and affect mental function and movement. No one knows exactly what causes the buildup of Lewy bodies, but it may be related to Alzheimer's dementia or Parkinson's disease.  SYMPTOMS   Memory problems.  Confusion.  Reduced attention span.  Hallucinations.  False ideas about another person or situation (delusions).  Trouble moving (rigid muscles, slowed movement, and tremors).  Shuffling movements (gait).  Sleep problems (acting out dreams).  Fluctuating attention (periods of drowsiness or lethargy, long periods of time spent staring into space, or disorganized speech). DIAGNOSIS There is no specific test to diagnose dementia with Lewy bodies, but your caregiver will evaluate you based on your symptoms and physical exam. Your evaluation may also include:  Memory testing.  Lab tests.  Brain imaging (CT scan, MRI).  Electroencephalogram (EEG).  Lumbar puncture. TREATMENT  There is no cure for dementia with Lewy bodies. Medicines may be prescribed to help with mental function and movement.  HOME CARE INSTRUCTIONS The care of individuals with dementia is varied and dependent upon the progression of the dementia. The following suggestions are intended for the person living with, or caring for, the person with  dementia.  Create a safe environment.  Remove the locks on bathroom doors to prevent the person from accidentally locking himself or herself in.  Use childproof latches on kitchen cabinets and any place where cleaning supplies, chemicals, or alcohol are kept.  Use childproof covers in unused electrical outlets.  Install childproof devices to keep doors and windows secured.  Remove stove knobs or install safety knobs and an automatic shut-off on the stove.  Lower the temperature on water heaters.  Label medicines and keep them locked up.  Secure knives, lighters, matches, power tools, and guns, and keep these items out of reach.  Keep the house free from clutter. Remove rugs or anything that might contribute to a fall.  Remove objects that might break and hurt the person.  Make sure lighting is good, both inside and outside.  Install grab rails as needed.  Use a monitoring device to alert you to falls or other needs for help.  Reduce confusion.  Keep familiar objects and people around.  Use night lights or dim lights at night.  Label items or areas.  Use reminders, notes, or directions for daily activities or tasks.  Keep a simple, consistent routine for waking, meals, bathing, dressing, and bedtime.  Create a calm, quiet environment.  Place large clocks and calendars prominently.  Display emergency numbers and the home address near all telephones.  Use cues to establish different times of the day. An example is to open curtains to let the natural light in during the day.  Use effective communication.  Choose simple words and short sentences.  Use a gentle, calm tone of voice.  Be careful not to interrupt.  If the person is struggling to find a word or communicate a thought, try to provide the   word or thought.  Ask one question at a time. Allow the person ample time to answer questions. Repeat the question again if the person does not respond.  Reduce  nighttime restlessness.  Provide a comfortable bed.  Have a consistent nighttime routine.  Ensure a regular walking or physical activity schedule. Involve the person in daily activities as much as possible.  Limit napping during the day.  Limit caffeine.  Attend social events that stimulate rather than overwhelm the senses.  Encourage good nutrition and hydration.  Reduce distractions during meal times and snacks.  Avoid foods that are too hot or too cold.  Monitor chewing and swallowing ability.  Continue with routine vision, hearing, dental, and medical screenings.  Only give over-the-counter or prescription medicines as directed by the caregiver.  Monitor driving abilities. Do not allow the person to drive when safe driving is no longer possible.  Register with an identification program which could provide location assistance in the event of a missing person situation. SEEK MEDICAL CARE IF:  New behavioral problems develop, such as moodiness or aggressiveness.  Any new problem with brain function develop, such as balance, speech, or falling a lot.  Problems with swallowing develop. Small changes or worsening in any aspect of brain function can be a sign that the illness is getting worse. It can also be a sign of another medical illness such as infection. Seeing a caregiver right away is important.  SEEK IMMEDIATE MEDICAL CARE IF:  A fever develops.  Confusion develops or worsens.  New or worsened sleepiness develops or staying awake becomes difficult. Document Released: 07/25/2002 Document Revised: 01/25/2012 Document Reviewed: 06/29/2011 ExitCare Patient Information 2015 [employer]. This information is not intended to replace advice given to you by your health care provider. Make sure you discuss any questions you have with your health care provider.  

## 2014-07-03 NOTE — Progress Notes (Signed)
[facility]  Provider:  Dr Babetta Paterson Referring Provider: Tamera Stands, MD Primary Care Physician:  Tamera Stands, MD  Chief Complaint  Patient presents with  . Follow-up    Room 11  . Dementia    HPI:  JOURDYN HASLER is a 80 y.o. female here as a referral from Dr.Terry Anold .  She resides at [facility] within  a Memory Unit.    [name], and he might 80 year old right-handed Caucasian female was in  2010  hospitalized after an acute delirious decompensation and behavior changes. She was admitted to behavior health for 12 days, and  afterwards in  [facility].  The patient's underlying memory loss has progressed : she also lost a lot of weight over the first 3 years of her illness, unable to remember meals, she eats very slowly.   followup she is now stable and has not changed her weight since July of last year it began when she breathed 110 pounds.  Meanwhile her appetite seems to have improved and the patient is been very active as an open facility goes on field trips and walks the halls  incessantly.  The patient believes  She is  employed there and tries to help and assist.  She has  a very shuffling gait , which begun placing her at a higher risk of falls. She has no dominant tremor.  At the senior center she is currently treated with Seroquel 25 mg 3 times a day which is less likely to cause parkinsonism  (she also no longer  receives Valium nor Haldol, due to sec, parkinonism,   xanax which is shorter acting and seems to have helped her agitation better.  Social history :  the patient has 2 adult daughters ,she was long-time divorced when her former  husband moved back into her house to assist her . She  has one sister with schizophrenia / possible bipolar disorder.   The patient used to enjoy caffeine she is still drinking coffee. She quit smoking in the 1970s she denies any drug use she has never been a heavy drinker.  Family  history : her father died at age 62 after a lung cancer metastasized to the brain her mother died at age 73 after that a. gangrene with sepsis developed. The patient herself  has a cancer of the breast history,  her father had heart disease and diabetes,  her mother had diabetes as well.  The last 2 visits we have been unable to obtain an am and has equal Mini-Mental test done in 2012 score was 10 points- the test however is not important at this degree of stage of her disease.   In July 2013 the patient had a fall and and was evaluated at [facility] emergency room. She had reported fall since. The patient is conversant, but not necessarily cooperative. The patient's daughter reports that her mother has started to chew tablets and capsules instead  of swallowing them all. The patient also had the sudden 14 reported that she couldn't smell as well anymore I think that she snored having impaired taste sensation as well.         Review of Systems: Out of a complete 14 system review, the patient complains of only the following symptoms, and all other reviewed systems are negative. Dementia   History   Social History  . Marital Status: Divorced    Spouse Name: N/A    Number of Children: 2  . Years  of Education: 12   Occupational History  .      former Armed forces logistics/support/administrative officer   Social History Main Topics  . Smoking status: Former Research scientist (life sciences)  . Smokeless tobacco: Never Used     Comment: quit in 70's  . Alcohol Use: No  . Drug Use: No  . Sexual Activity: Not on file   Other Topics Concern  . Not on file   Social History Narrative   Patient is divorced and lives in a nursing home where her ex-husband is also there.   Patient has two children.   Patient is retired.   Patient has a GED.   Patient is right-handed.   Patient drinks two glasses of tea daily and maybe one cup of coffee.    History reviewed. No pertinent family history.  Past Medical History  Diagnosis Date  . Memory loss    . LBD (Lewy body dementia)   . Cancer     Past Surgical History  Procedure Laterality Date  . Breast lumpectomy Left   . Vaginal hysterectomy      Current Outpatient Prescriptions  Medication Sig Dispense Refill  . carbidopa-levodopa (SINEMET) 10-100 MG per tablet Take 1 tablet by mouth 3 (three) times daily. 9am,1pm, 4pm  90 tablet  11  . DIVALPROEX SODIUM PO Take 125 mg by mouth 2 (two) times daily. 4 capsules  At bedtime, do not crush      . guaiFENesin (ROBITUSSIN) 100 MG/5ML liquid Take by mouth 3 (three) times daily as needed for cough. Take 10 ml every 6 hours as needed      . hydrOXYzine (ATARAX/VISTARIL) 50 MG tablet Take 50 mg by mouth 2 (two) times daily as needed. For itching      . loperamide (ANTI-DIARRHEAL) 2 MG tablet Take 2 mg by mouth. 1 tablet after  Each loose stools up to 6 tablets in 24 hours      . mupirocin ointment (BACTROBAN) 2 % Place 1 application into the nose daily. Apply to back of neck and cover with dry dressing daily.      . QUEtiapine (SEROQUEL) 25 MG tablet Take 1 tablet (25 mg total) by mouth 2 (two) times daily. One dose at lunchtime, and one at bedtime  60 tablet  11  . Sennosides-Docusate Sodium (SENNA PLUS PO) Take by mouth daily. 2 tablets at bedtime, do not crush      . traZODone (DESYREL) 50 MG tablet Take 1 tablet (50 mg total) by mouth at bedtime. 1 daily  30 tablet  11   No current facility-administered medications for this visit.    Allergies as of 07/03/2014 - Review Complete 07/03/2014  Allergen Reaction Noted  . Haldol [haloperidol lactate]  05/24/2013  . Namenda [memantine hcl]  05/24/2013    Vitals: BP 140/83  Pulse 85  Resp 17  Wt 123 lb (55.792 kg) Last Weight:  Wt Readings from Last 1 Encounters:  07/03/14 123 lb (55.792 kg)   Last Height:   Ht Readings from Last 1 Encounters:  11/24/13 4' 10.5" (1.486 m)     Physical exam:  General: The patient is awake, appears not in acute distress. The patient is well  groomed. Head: Normocephalic, atraumatic. Neck is supple.  neck circumference: 13 inches ,  Cardiovascular:  Regular rate and rhythm, without  murmurs or carotid bruit, and without distended neck veins. Respiratory: Lungs are clear to auscultation. Skin:  Without evidence of edema, or rash Trunk: BMI is normal posture.  Neurologic exam :  The patient is awake and alert, oriented to place and time.   Memory , attention span & concentration ability are severely limited.  MMSE not done.  Speech is fluent , repetitive , perseveration,  Mood and affect are aloof.  The patient presents with a great variability of cognition day by day.   Cranial nerves: Pupils are equal and briskly reactive to light.   Extraocular movements  in vertical and horizontal planes with ,coarse  and with nystagmus. Visual fields by finger perimetry are intact. Hearing to finger rub intact.  Facial sensation intact to fine touch. Facial motor strength is symmetric and tongue and uvula move midline.  Motor exam:   Coarsely elevated tone and normal muscle bulk , but symmetric normal strength in all extremities.  Sensory:  Fine touch, pinprick and vibration were tested in all extremities patient was not able to appreciate these.    Mild tremor.  Gait and station: Patient walks without assistive device.  DTR: 2/2 in all extremities.  Down going babinski response.     Continue Seroquel.  Trazodone.  Consider to reduce Sinemet as this patient has no parkinsonian features today.    Assessement :  Lewy body dementia , 5 th year of her illness.

## 2014-10-03 ENCOUNTER — Encounter: Payer: Self-pay | Admitting: Neurology

## 2014-10-09 ENCOUNTER — Encounter: Payer: Self-pay | Admitting: Neurology

## 2014-12-25 ENCOUNTER — Telehealth: Payer: Self-pay

## 2014-12-25 NOTE — Telephone Encounter (Signed)
Spoke to daughter Venida Jarvis about upcoming appt. Sherri confirmed aware of r/s to 01/11/15 made per sister's schedule. Sherri wanted NP-Carolyn to know that the patient has stopped eating and is sleeping a whole lot more. Offered earlier appt than 01/11/15. Sherri has moved out of town and her sister is handling appts for patient. Advised to contact PCP or have patient seen in facility  where she lives. She agreed, just wanted Hoyle Sauer to know.

## 2014-12-25 NOTE — Telephone Encounter (Signed)
Noted  

## 2015-01-03 ENCOUNTER — Ambulatory Visit: Payer: Medicare Other | Admitting: Nurse Practitioner

## 2015-01-11 ENCOUNTER — Encounter: Payer: Self-pay | Admitting: Nurse Practitioner

## 2015-01-11 ENCOUNTER — Ambulatory Visit (INDEPENDENT_AMBULATORY_CARE_PROVIDER_SITE_OTHER): Payer: Medicare Other | Admitting: Nurse Practitioner

## 2015-01-11 VITALS — BP 153/89 | HR 90 | Ht 59.0 in | Wt 112.0 lb

## 2015-01-11 DIAGNOSIS — F0280 Dementia in other diseases classified elsewhere without behavioral disturbance: Secondary | ICD-10-CM

## 2015-01-11 DIAGNOSIS — G3183 Dementia with Lewy bodies: Secondary | ICD-10-CM | POA: Diagnosis not present

## 2015-01-11 NOTE — Patient Instructions (Signed)
Per skilled nursing sheet 

## 2015-01-11 NOTE — Progress Notes (Signed)
I agree with the assessment and plan as directed by NP .The patient is known to me .   Sarh Kirschenbaum, MD  

## 2015-01-11 NOTE — Progress Notes (Signed)
[facility]  PATIENT: Robina Hamor Dangler DOB: [birth year]   REASON FOR VISIT: Follow-up for Lewy body dementia  HISTORY FROM:daughter and son in law    HISTORY OF PRESENT ILLNESS:Ms Diana, 81 year old returns for followup. She has a history of Lewy body dementia. She is unable to cooperate for Mini-Mental Status exam. She is currently in a nursing facility (dementia unit). She had been losing weight according to family ,112 lbs. In the office today.  Last weight in our office when last seen by Dr. Brett Fairy 07/03/14 was 123 lbs. She has been placed on Megase to stimulate the appetite. She remains on Seroquel and Xanax for agitation. No recent falls. She is also on low-dose Sinemet 3 times daily. Daughter also reports occasional hallucinations. Ms Yonan apparently gets around the facility without an assistive device. She returns for reevaluation     HISTORY: 81 year old right-handed Caucasian female was in 2010 hospitalized after an acute delirious decompensation and behavior changes. She was admitted to behavior health for 12 days, and afterwards in [facility].  The patient's underlying memory loss has progressed : she also lost a lot of weight over the first 3 years of her illness, unable to remember meals, she eats very slowly.  followup she is now stable and has not changed her weight since July of last year it began when she breathed 110 pounds.  Meanwhile her appetite seems to have improved and the patient is been very active as an open facility goes on field trips and walks the halls incessantly.  The patient believes She is employed there and tries to help and assist.  She has a very shuffling gait , which begun placing her at a higher risk of falls. She has no dominant tremor.  At the senior center she is currently treated with Seroquel 25 mg 3 times a day which is less likely to cause parkinsonism (she also no longer receives Valium nor Haldol, due  to sec, parkinonism,  xanax which is shorter acting and seems to have helped her agitation better.    REVIEW OF SYSTEMS: Full 14 system review of systems performed and notable only for those listed, all others are neg:  Constitutional: appetite change  Cardiovascular: neg Ear/Nose/Throat: neg  Skin: neg Eyes: neg Respiratory: neg Gastroitestinal: neg  Hematology/Lymphatic: neg  Endocrine: neg Musculoskeletal:neg Allergy/Immunology: neg Neurological:Rare hallucinations  Psychiatric: neg Sleep : neg   ALLERGIES: Allergies  Allergen Reactions  . Haldol [Haloperidol Lactate]   . Pearlean Brownie Hcl]     HOME MEDICATIONS: Outpatient Prescriptions Prior to Visit  Medication Sig Dispense Refill  . carbidopa-levodopa (SINEMET) 10-100 MG per tablet Take 1 tablet by mouth 3 (three) times daily. 9am,1pm, 4pm 90 tablet 11  . DIVALPROEX SODIUM PO Take 125 mg by mouth 2 (two) times daily. 4 capsules  At bedtime, do not crush    . guaiFENesin (ROBITUSSIN) 100 MG/5ML liquid Take by mouth 3 (three) times daily as needed for cough. Take 10 ml every 6 hours as needed    . hydrOXYzine (ATARAX/VISTARIL) 50 MG tablet Take 50 mg by mouth 2 (two) times daily as needed. For itching    . loperamide (ANTI-DIARRHEAL) 2 MG tablet Take 2 mg by mouth. 1 tablet after  Each loose stools up to 6 tablets in 24 hours    . mupirocin ointment (BACTROBAN) 2 % Place 1 application into the nose daily. Apply to back of neck and cover with dry dressing daily.    . QUEtiapine (  SEROQUEL) 25 MG tablet Take 1 tablet (25 mg total) by mouth 2 (two) times daily. One dose at lunchtime, and one at bedtime 60 tablet 11  . Sennosides-Docusate Sodium (SENNA PLUS PO) Take by mouth daily. 2 tablets at bedtime, do not crush    . traZODone (DESYREL) 50 MG tablet Take 1 tablet (50 mg total) by mouth at bedtime. 1 daily 30 tablet 11   No facility-administered medications prior to visit.    PAST MEDICAL HISTORY: Past Medical  History  Diagnosis Date  . Memory loss   . LBD (Lewy body dementia)   . Cancer     PAST SURGICAL HISTORY: Past Surgical History  Procedure Laterality Date  . Breast lumpectomy Left   . Vaginal hysterectomy      FAMILY HISTORY: No family history on file.  SOCIAL HISTORY: History   Social History  . Marital Status: Divorced    Spouse Name: N/A  . Number of Children: 2  . Years of Education: 12   Occupational History  .      former Armed forces logistics/support/administrative officer   Social History Main Topics  . Smoking status: Former Research scientist (life sciences)  . Smokeless tobacco: Never Used     Comment: quit in 70's  . Alcohol Use: No  . Drug Use: No  . Sexual Activity: Not on file   Other Topics Concern  . Not on file   Social History Narrative   Patient is divorced and lives in a nursing home where her ex-husband is also there.   Patient has two children.   Patient is retired.   Patient has a GED.   Patient is right-handed.   Patient drinks two glasses of tea daily and maybe one cup of coffee.        PHYSICAL EXAM  Filed Vitals:   01/11/15 1014  BP: 153/89  Pulse: 90  Height: 4\' 11"  (1.499 m)  Weight: 112 lb (50.803 kg)   Body mass index is 22.61 kg/(m^2). Generalized: Well developed, well groomed in no acute distress  Head: normocephalic and atraumatic,. Oropharynx benign  Neck: Supple, no carotid bruits  Cardiac: Regular rate rhythm, no murmur  Musculoskeletal: No deformity   Neurological examination   Mentation: Alert oriented to time, place, refused MMSE. Memory is severely limited Follows few  commands.  Cranial nerve II-XII: Pupils were equal round reactive to light extraocular movements were full, visual field were full on confrontational test. Facial sensation and strength were normal. hearing was intact to finger rubbing bilaterally. Uvula tongue midline. head turning and shoulder shrug were normal and symmetric.Tongue protrusion into cheek strength was normal. Motor: increased tone,  full strength in the BUE, BLE, No focal weakness Sensory: not reliable Coordination: unable to perform Gait and Station: Rising up from seated position without assistance, normal stance, moderate stride, good arm swing, smooth turning, no assistive device  DIAGNOSTIC DATA (LABS, IMAGING, TESTING) -  ASSESSMENT AND PLAN  81 y.o. year old female  has a past medical history of Memory loss; LBD (Lewy body dementia);  here to follow-up.   Decrease Sinemet to BID at 9am and 5 pm. Hopefully hallucinations will decrease Continue Seroquel 25 mg twice daily for agitation Continue trazodone 50 at bedtime Follow-up in 8 months Dennie Bible, [facility], [facility], APRN  [facility] [address] [place],  [address] [phone]

## 2015-08-08 ENCOUNTER — Encounter: Payer: Self-pay | Admitting: Nurse Practitioner

## 2015-08-08 ENCOUNTER — Ambulatory Visit (INDEPENDENT_AMBULATORY_CARE_PROVIDER_SITE_OTHER): Payer: Medicare Other | Admitting: Nurse Practitioner

## 2015-08-08 ENCOUNTER — Encounter: Payer: Self-pay | Admitting: *Deleted

## 2015-08-08 VITALS — BP 111/66 | HR 74

## 2015-08-08 DIAGNOSIS — G3183 Dementia with Lewy bodies: Secondary | ICD-10-CM

## 2015-08-08 DIAGNOSIS — F0280 Dementia in other diseases classified elsewhere without behavioral disturbance: Secondary | ICD-10-CM | POA: Diagnosis not present

## 2015-08-08 NOTE — Progress Notes (Signed)
I have read the note, and I agree with the clinical assessment and plan.  WILLIS,CHARLES KEITH   

## 2015-08-08 NOTE — Progress Notes (Signed)
[facility]  PATIENT: Norma Boyd DOB: [birth year]   REASON FOR VISIT: Follow-up for Lewy body dementia HISTORY FROM: Daughter  HISTORY OF PRESENT ILLNESS:Norma Boyd, 81 year old returns for followup. She has a history of Lewy body dementia. She is unable to cooperate for Mini-Mental Status exam. She is currently in a nursing facility (dementia unit). She had been losing weight according to family ,107 at the facility on 06/26/15. She has been placed on Megase to stimulate the appetite. She remains on Seroquel and Xanax for agitation. She is also on low-dose Sinemet 2 times daily. 2 recent falls in August, where she broke her wrist the other where she had a contusion to the left eye. CT of the head was without acute findings. Daughter also reports rare hallucination. Norma Boyd now has rails on her bed and a seatbelt to her wheelchair to keep her from falling. She no longer recognizes her daughter or her husband.  She returns for reevaluation    HISTORY: 81 year old right-handed Caucasian female was in 2010 hospitalized after an acute delirious decompensation and behavior changes. She was admitted to behavior health for 12 days, and afterwards in [facility].  The patient's underlying memory loss has progressed : she also lost a lot of weight over the first 3 years of her illness, unable to remember meals, she eats very slowly.  followup she is now stable and has not changed her weight since July of last year it began when she breathed 110 pounds.  Meanwhile her appetite seems to have improved and the patient is been very active as an open facility goes on field trips and walks the halls incessantly.  The patient believes She is employed there and tries to help and assist.  She has a very shuffling gait , which begun placing her at a higher risk of falls. She has no dominant tremor.  At the senior center she is currently treated with Seroquel 25 mg 3  times a day which is less likely to cause parkinsonism (she also no longer receives Valium nor Haldol, due to sec, parkinonism,  xanax which is shorter acting and seems to have helped her agitation better.    REVIEW OF SYSTEMS: Full 14 system review of systems performed and notable only for those listed, all others are neg:  Constitutional: neg  Cardiovascular: neg Ear/Nose/Throat: neg  Skin: neg Eyes: neg Respiratory: Cough Gastroitestinal: neg  Hematology/Lymphatic: Easy bruising  Endocrine: neg Musculoskeletal: Joint pain Allergy/Immunology: neg Neurological: neg Psychiatric: neg Sleep : neg   ALLERGIES: Allergies  Allergen Reactions  . Haldol [Haloperidol Lactate]   . Pearlean Brownie Hcl]     HOME MEDICATIONS: Outpatient Prescriptions Prior to Visit  Medication Sig Dispense Refill  . acetaminophen (TYLENOL) 500 MG tablet Take 500 mg by mouth every 6 (six) hours as needed.    . ALPRAZolam (XANAX) 0.25 MG tablet Take 0.25 mg by mouth at bedtime as needed for anxiety.    . carbidopa-levodopa (SINEMET) 10-100 MG per tablet Take 1 tablet by mouth 3 (three) times daily. 9am,1pm, 4pm (Patient taking differently: Take 1 tablet by mouth 2 (two) times daily. 9am,5pm) 90 tablet 11  . DIVALPROEX SODIUM PO Take 125 mg by mouth 2 (two) times daily. 4 capsules  At bedtime, do not crush    . guaiFENesin (ROBITUSSIN) 100 MG/5ML liquid Take by mouth 3 (three) times daily as needed for cough. Take 10 ml every 6 hours as needed    . hydrOXYzine (ATARAX/VISTARIL) 50  MG tablet Take 50 mg by mouth 2 (two) times daily as needed. For itching    . loperamide (ANTI-DIARRHEAL) 2 MG tablet Take 2 mg by mouth. 1 tablet after  Each loose stools up to 6 tablets in 24 hours    . megestrol (MEGACE ES) 625 MG/5ML suspension Take 625 mg by mouth daily.    . promethazine (PHENERGAN) 25 MG tablet Take 25 mg by mouth every 6 (six) hours as needed for nausea or vomiting.    Marland Kitchen QUEtiapine (SEROQUEL) 25 MG  tablet Take 1 tablet (25 mg total) by mouth 2 (two) times daily. One dose at lunchtime, and one at bedtime 60 tablet 11  . Sennosides-Docusate Sodium (SENNA PLUS PO) Take by mouth daily. 2 tablets at bedtime, do not crush    . traZODone (DESYREL) 50 MG tablet Take 1 tablet (50 mg total) by mouth at bedtime. 1 daily 30 tablet 11   No facility-administered medications prior to visit.    PAST MEDICAL HISTORY: Past Medical History  Diagnosis Date  . Memory loss   . LBD (Lewy body dementia)   . Cancer     PAST SURGICAL HISTORY: Past Surgical History  Procedure Laterality Date  . Breast lumpectomy Left   . Vaginal hysterectomy      FAMILY HISTORY: No family history on file.  SOCIAL HISTORY: Social History   Social History  . Marital Status: Divorced    Spouse Name: N/A  . Number of Children: 2  . Years of Education: 12   Occupational History  .      former Armed forces logistics/support/administrative officer   Social History Main Topics  . Smoking status: Former Research scientist (life sciences)  . Smokeless tobacco: Never Used     Comment: quit in 70's  . Alcohol Use: No  . Drug Use: No  . Sexual Activity: Not on file   Other Topics Concern  . Not on file   Social History Narrative   Patient is divorced and lives in [place] in [place] where her ex-husband is there also.    Patient has two children.   Patient is retired.   Patient has a GED.   Patient is right-handed.   Patient drinks two glasses of tea daily and maybe one cup of coffee.        PHYSICAL EXAM  Filed Vitals:   08/08/15 1341  BP: 111/66  Pulse: 74   There is no weight on file to calculate BMI. Generalized: Well developed, well groomed in no acute distress  Head: normocephalic and atraumatic,. Oropharynx benign left eye is reddened from previous fall Neck: Supple, no carotid bruits  Musculoskeletal: No deformity   Neurological examination   Mentation: Alert unable to obtain  MMSE. Memory is severely limited Follows no  commands.  Cranial  nerve II-XII: Pupils were equal round reactive to light extraocular movements were full,  Facial sensation and strength were normal. hearing was intact to finger rubbing bilaterally. Uvula tongue midline. head turning and shoulder shrug were normal and symmetric. Motor: increased tone, appears to have full strength in the BUE, BLE, difficult to test Sensory: not reliable Coordination: unable to perform Gait and Station: not ambulated in wheelchair DIAGNOSTIC DATA (LABS, IMAGING, TESTING) -  ASSESSMENT AND PLAN  81 y.o. year old female  has a past medical history of Memory loss; LBD (Lewy body dementia); here to follow up. The patient is a current patient of Dr. Brett Fairy  who is out of the office today . This note is sent  to the work in doctor.     Continue Sinemet twice daily Continue Seroquel 25 mg twice daily for agitation Continue trazodone at bedtime Follow-up in 6-8 months Dennie Bible, [facility], [facility], APRN  [facility] [address], [place], [place] [address] [phone]

## 2015-08-09 ENCOUNTER — Ambulatory Visit: Payer: Medicare Other | Admitting: Nurse Practitioner

## 2015-09-11 NOTE — Telephone Encounter (Signed)
Error

## 2016-04-06 ENCOUNTER — Ambulatory Visit (INDEPENDENT_AMBULATORY_CARE_PROVIDER_SITE_OTHER): Admitting: Neurology

## 2016-04-06 ENCOUNTER — Encounter: Payer: Self-pay | Admitting: Neurology

## 2016-04-06 VITALS — HR 72 | Resp 18

## 2016-04-06 DIAGNOSIS — F0280 Dementia in other diseases classified elsewhere without behavioral disturbance: Secondary | ICD-10-CM

## 2016-04-06 DIAGNOSIS — G3183 Dementia with Lewy bodies: Secondary | ICD-10-CM | POA: Diagnosis not present

## 2016-04-06 MED ORDER — QUETIAPINE FUMARATE 25 MG PO TABS: ORAL_TABLET | ORAL | Status: DC

## 2016-04-06 NOTE — Progress Notes (Signed)
[facility]  Provider:  Dr Djuna Frechette Referring Provider: Amado Nash, MD Primary Care Physician:  Tamera Stands, MD  Chief Complaint  Patient presents with  . Follow-up    memory, unable to complete MMSE, non verbal, rm 11, with daughter    HPI:  Norma Boyd is a 82 y.o. female here as a referral from Dr.Terry Anold .  She resides at [facility] within  a Memory Unit.    Mrs. Norma Boyd, at 82 year old right-handed Caucasian female was in  2010  hospitalized after an acute delirious decompensation and behavior changes. She was admitted to behavior health for 12 days, and  afterwards in [facility]. She is now living at [facility].  The patient's underlying memory loss has progressed  : she also lost a lot of weight over the first 3 years of her illness, unable to remember meals, she eats very slowly. She has a very shuffling gait , which begun placing her at a higher risk of falls. She spends a significant amount of time in either a wheelchair on armchair because of his high fall risk now. She has no dominant tremor. At the senior center she is currently treated with Seroquel 25 mg 3 times a day which is less likely to cause parkinsonism  (she also no longer  receives Valium nor Haldol, due to sec, parkinonism, Xanax which is shorter acting and seems to have helped her agitation better.  Social history : The patient has 2 adult daughters ,she was long-time divorced when her former husband moved back into her house to assist her . She has one sister with schizophrenia / possible bipolar disorder, she is also in a dementia care unit. Her father died at age 79 after a lung cancer metastasized to the brain her mother died at age 86 after that a. gangrene with sepsis developed. The patient herself  has a cancer of the breast history,  her father had heart disease and diabetes,  her mother had diabetes as well.  The patient used to  enjoy caffeine,  she is not drinking coffee any longer. . She quit smoking in the 1970s . she denies any drug use she has never been a heavy drinker.  Family history : The last 2 visits we have been unable to obtain an am and has equal Mini-Mental test done in 2012 score was 10 points- the test however is not important at this degree of stage of her disease.  In July 2013 the patient had a fall and and was evaluated at [facility] emergency room. She had reported fall since. The patient is conversant, but not necessarily cooperative. The patient's daughter reports that her mother has started to chew tablets and capsules instead  of swallowing them all. The patient also had the sudden 12 reported that she couldn't smell as well anymore I think that she snored having impaired taste sensation as well.  Review of Systems: Out of a complete 14 system review, the patient complains of only the following symptoms, and all other reviewed systems are negative. Dementia m Lewy body with auditory  hallucinations.   Social History   Social History  . Marital Status: Divorced    Spouse Name: N/A  . Number of Children: 2  . Years of Education: 12   Occupational History  .      former Armed forces logistics/support/administrative officer   Social History Main Topics  . Smoking status: Former Research scientist (life sciences)  . Smokeless tobacco: Never Used  Comment: quit in 70's  . Alcohol Use: No  . Drug Use: No  . Sexual Activity: Not on file   Other Topics Concern  . Not on file   Social History Narrative   Patient is divorced and lives in [place] in [place] where her ex-husband is there also.    Patient has two children.   Patient is retired.   Patient has a GED.   Patient is right-handed.   Patient drinks two glasses of tea daily and maybe one cup of coffee.       No family history on file.  Past Medical History  Diagnosis Date  . Memory loss   . LBD (Lewy body dementia)   . Cancer [facility])     Past Surgical History  Procedure  Laterality Date  . Breast lumpectomy Left   . Vaginal hysterectomy      Current Outpatient Prescriptions  Medication Sig Dispense Refill  . acetaminophen (TYLENOL) 500 MG tablet Take 500 mg by mouth every 6 (six) hours as needed.    . carbidopa-levodopa (SINEMET) 10-100 MG per tablet Take 1 tablet by mouth 3 (three) times daily. 9am,1pm, 4pm (Patient taking differently: Take 1 tablet by mouth 2 (two) times daily. 9am,5pm) 90 tablet 11  . DIVALPROEX SODIUM PO Take 125 mg by mouth 2 (two) times daily. 4 capsules  At bedtime, do not crush    . guaiFENesin (ROBITUSSIN) 100 MG/5ML liquid Take by mouth 3 (three) times daily as needed for cough. Take 10 ml every 6 hours as needed    . haloperidol (HALDOL) 2 MG/ML solution Take 1 mg by mouth every 2 (two) hours as needed for agitation.    . hydrOXYzine (ATARAX/VISTARIL) 50 MG tablet Take 50 mg by mouth 2 (two) times daily as needed. For itching    . loperamide (IMODIUM A-D) 2 MG tablet Take 2 mg by mouth 4 (four) times daily as needed for diarrhea or loose stools.    Marland Kitchen LORazepam (ATIVAN) 0.5 MG tablet Take 0.5 mg by mouth every 8 (eight) hours.    . mirtazapine (REMERON) 15 MG tablet Take 7.5 mg by mouth at bedtime.    Marland Kitchen morphine 20 MG/5ML solution Take 5 mg by mouth every 2 (two) hours as needed for pain.    . promethazine (PHENERGAN) 25 MG tablet Take 25 mg by mouth every 6 (six) hours as needed for nausea or vomiting.    Marland Kitchen QUEtiapine (SEROQUEL) 25 MG tablet Take 1 tablet (25 mg total) by mouth 2 (two) times daily. One dose at lunchtime, and one at bedtime 60 tablet 11  . Sennosides-Docusate Sodium (SENNA PLUS PO) Take by mouth daily. 2 tablets at bedtime, do not crush    . traZODone (DESYREL) 50 MG tablet Take 1 tablet (50 mg total) by mouth at bedtime. 1 daily 30 tablet 11   No current facility-administered medications for this visit.    Allergies as of 04/06/2016 - Review Complete 04/06/2016  Allergen Reaction Noted  . Haldol [haloperidol  lactate]  05/24/2013  . Namenda [memantine hcl]  05/24/2013    Vitals: BP   Pulse 72  Resp 18  Ht   Wt  Last Weight:  Wt Readings from Last 1 Encounters:  01/11/15 112 lb (50.803 kg)   Last Height:   Ht Readings from Last 1 Encounters:  01/11/15 4\' 11"  (1.499 m)     Physical exam:  General: The patient is awake, appears not in acute distress. The patient is well groomed.  Head: Normocephalic, atraumatic. Neck is supple. Neck circumference: 12.5  inches ,  Cardiovascular:  Regular rate and rhythm, without  murmurs or carotid bruit, and without distended neck veins. Respiratory: Lungs are clear to auscultation.Skin:  Without evidence of edema, or rashTrunk: fetal position. Neurologic exam :The patient is awake and alert, oriented to place and time.   Memory , attention span & concentration ability are severely limited.MMSE not done. Speech is fluent , repetitive , perseveration,  Mood and affect are aloof.  The patient presents with a great variability of cognition day by day.   Cranial nerves: Pupils are equal and briskly reactive to light.   Extraocular movements  in vertical and horizontal planes with ,coarse  and with nystagmus. Visual fields by finger perimetry are intact. Hearing to finger rub intact.  Facial sensation intact to fine touch.  Facial motor strength is symmetric and tongue and uvula move midline. Motor exam:   Coarsely elevated tone and atrophied muscle bulk, but symmetric strength in all extremities. Sensory:  Fine touch, pinprick and vibration were tested in all extremities patient was not able to appreciate these. Mild tremor. Gait and station: Patient walks no longer - falls frequently and sits for safety in a wheelchair.  DTR: 2/2 in all extremities. Mrs. Chris gross muscle tone is elevated. She is also trembling, she is continuously moving legs hands or arms and she prefers a fetal position.   Reduce  Seroquel.  Trazodone.  Consider to reduce Sinemet as  this patient has no parkinsonian features today.    Assessement :  Dementia with Lewy Bodies , 7 th year of her illness.  Since Mrs. Collen is no quite sleepy excessively in daytime I think he can reduce her Seroquel and I hope that it will not allow her to slip back into florid hallucinations. I received reviewed the medication list from her residence. She is also taking trazodone at bedtime I have no problem with this medication, she takes Seroquel 1 tablet at lunch and one at bedtime I would like to suggest to stop the lunchtime dose. Remeron at night is fine Depakote is fine ,Sinemet twice a day can be discontinued.

## 2016-04-06 NOTE — Patient Instructions (Signed)
I have discontinued the patient's carbidopa levodopa, I would also like to reduce her Seroquel to one dose at bedtime only if she continues to be hypersonic but not nocturnally active he could either stop the trazodone or Remeron next perhaps by first reducing 50% of dose.

## 2016-10-12 ENCOUNTER — Ambulatory Visit: Admitting: Nurse Practitioner

## 2016-10-14 ENCOUNTER — Ambulatory Visit (INDEPENDENT_AMBULATORY_CARE_PROVIDER_SITE_OTHER): Admitting: Nurse Practitioner

## 2016-10-14 ENCOUNTER — Encounter: Payer: Self-pay | Admitting: Nurse Practitioner

## 2016-10-14 VITALS — BP 109/72 | HR 89 | Ht 59.0 in

## 2016-10-14 DIAGNOSIS — F0280 Dementia in other diseases classified elsewhere without behavioral disturbance: Secondary | ICD-10-CM

## 2016-10-14 DIAGNOSIS — G3183 Dementia with Lewy bodies: Secondary | ICD-10-CM | POA: Diagnosis not present

## 2016-10-14 NOTE — Patient Instructions (Signed)
Per skilled notes

## 2016-10-14 NOTE — Progress Notes (Signed)
[facility]  PATIENT: Norma Boyd DOB: [birth year]   REASON FOR VISIT: Follow-up for memory body dementia HISTORY FROM: Daughter    HISTORY OF PRESENT ILLNESS: UPDATE 11/29/2017CM Norma Boyd, 82 year old female returns for follow-up with her daughter. She continues to to reside in a memory unit. She fell out of a chair couple weeks ago and received some stitches. She currently weighs 80 pounds although her daughter says she eats well if she is fed. She continues to be excessively sleepy during the day. She ambulates very little just for transfers. She is total care. Hospice was called in about 3 months ago. She returns for reevaluation  04/06/16 Norma Boyd is a 82 y.o. female here as a referral from Dr.Terry Anold .  She resides at [facility] within  a Memory Unit.    Norma Boyd, at 82 year old right-handed Caucasian female was in  2010  hospitalized after an acute delirious decompensation and behavior changes. She was admitted to behavior health for 12 days, and  afterwards in [facility]. She is now living at [facility].  The patient's underlying memory loss has progressed  : she also lost a lot of weight over the first 3 years of her illness, unable to remember meals, she eats very slowly. She has a very shuffling gait , which begun placing her at a higher risk of falls. She spends a significant amount of time in either a wheelchair on armchair because of his high fall risk now. She has no dominant tremor. At the senior center she is currently treated with Seroquel 25 mg 3 times a day which is less likely to cause parkinsonism  (she also no longer  receives Valium nor Haldol, due to sec, parkinonism, Xanax which is shorter acting and seems to have helped her agitation better   REVIEW OF SYSTEMS: Full 14 system review of systems performed and notable only for those listed, all others are neg:  Constitutional: neg    Cardiovascular: neg Ear/Nose/Throat: neg  Skin: neg Eyes: neg Respiratory: neg Gastroitestinal: Urinary incontinence Hematology/Lymphatic: neg  Endocrine: neg Musculoskeletal: Walking difficulty Allergy/Immunology: neg Neurological: Memory loss, speech difficulty weakness Psychiatric: neg Sleep : neg   ALLERGIES: Allergies  Allergen Reactions  . Haldol [Haloperidol Lactate]   . Namenda [Memantine Hcl]     HOME MEDICATIONS: Outpatient Medications Prior to Visit  Medication Sig Dispense Refill  . QUEtiapine (SEROQUEL) 25 MG tablet One dose at night. 30 tablet 11  . traZODone (DESYREL) 50 MG tablet Take 1 tablet (50 mg total) by mouth at bedtime. 1 daily 30 tablet 11  . acetaminophen (TYLENOL) 500 MG tablet Take 500 mg by mouth every 6 (six) hours as needed.    Marland Kitchen DIVALPROEX SODIUM PO Take 125 mg by mouth 2 (two) times daily. 4 capsules  At bedtime, do not crush    . guaiFENesin (ROBITUSSIN) 100 MG/5ML liquid Take by mouth 3 (three) times daily as needed for cough. Take 10 ml every 6 hours as needed    . haloperidol (HALDOL) 2 MG/ML solution Take 1 mg by mouth every 2 (two) hours as needed for agitation.    . hydrOXYzine (ATARAX/VISTARIL) 50 MG tablet Take 50 mg by mouth 2 (two) times daily as needed. For itching    . loperamide (IMODIUM A-D) 2 MG tablet Take 2 mg by mouth 4 (four) times daily as needed for diarrhea or loose stools.    Marland Kitchen LORazepam (ATIVAN) 0.5 MG tablet Take 0.5  mg by mouth every 8 (eight) hours.    . mirtazapine (REMERON) 15 MG tablet Take 7.5 mg by mouth at bedtime.    Marland Kitchen morphine 20 MG/5ML solution Take 5 mg by mouth every 2 (two) hours as needed for pain.    . promethazine (PHENERGAN) 25 MG tablet Take 25 mg by mouth every 6 (six) hours as needed for nausea or vomiting.    Orlie Dakin Sodium (SENNA PLUS PO) Take by mouth daily. 2 tablets at bedtime, do not crush     No facility-administered medications prior to visit.     PAST MEDICAL  HISTORY: Past Medical History:  Diagnosis Date  . Cancer ([place])   . LBD (Lewy body dementia)   . Memory loss     PAST SURGICAL HISTORY: Past Surgical History:  Procedure Laterality Date  . BREAST LUMPECTOMY Left   . VAGINAL HYSTERECTOMY      FAMILY HISTORY: History reviewed. No pertinent family history.  SOCIAL HISTORY: Social History   Social History  . Marital status: Divorced    Spouse name: N/A  . Number of children: 2  . Years of education: 12   Occupational History  .      former Armed forces logistics/support/administrative officer   Social History Main Topics  . Smoking status: Former Research scientist (life sciences)  . Smokeless tobacco: Never Used     Comment: quit in 70's  . Alcohol use No  . Drug use: No  . Sexual activity: Not on file   Other Topics Concern  . Not on file   Social History Narrative   Patient is divorced and lives in [place] in [place] where her ex-husband is there also.    Patient has two children.   Patient is retired.   Patient has a GED.   Patient is right-handed.   Patient drinks two glasses of tea daily and maybe one cup of coffee.        PHYSICAL EXAM  Vitals:   10/14/16 1406  BP: 109/72  Pulse: 89  Height: 4\' 11"  (1.499 m)   There is no height or weight on file to calculate BMI.  Generalized: Patient is drowsy, appears in no acute distress Head: normocephalic and atraumatic,. Oropharynx benign  Neck: Supple, no carotid bruits  Musculoskeletal: No deformity   Neurological examination   Mentation: Very drowsy but arouses to pain, no speech does not follow commands  Cranial nerve II-XII: Fundoscopic exam not done Pupils were equal round reactive to light , difficult exam due to drowsiness and inability to cooperate  Motor: Atrophied  muscle bulk and  elevated tone,good range of motion to all extremities. Sensory: Withdraws to pain  Coordination: Unable to perform  Reflexes: Brachioradialis 2/2, biceps 2/2, triceps 2/2, patellar 2/2, Achilles 2/2, plantar responses were  flexor bilaterally. Gait and Station: In wheelchair  DIAGNOSTIC DATA (LABS, IMAGING, TESTING) -  ASSESSMENT AND PLAN  82 year old female with Dementia with Lewy Bodies , 7 th year of her illness.  Since Mrs. Deleeuw is  quite sleepy excessively in daytime I think he can discontinue  her Seroquel . I  reviewed the medication list from her residence. She is also taking trazodone at bedtime   Remeron at night, and  Depakote. Hospice has been called in 3 months ago and daughter wishes not to return for further visits unless absolutely necessary I am agreeable to that. She will always follow up when necessary Norma Boyd, [facility], [facility], APRN  [facility] [address] [place],  Norma Boyd [address] [phone]

## 2016-10-15 NOTE — Progress Notes (Signed)
I agree with the assessment and plan as directed by NP .The patient is known to me . Her diagnosis is Lewy body dementia   Halyn Flaugher, MD

## 2016-11-16 DEATH — deceased
